# Patient Record
Sex: Female | Born: 1965 | Race: Black or African American | Hispanic: No | Marital: Single | Smoking: Never smoker
Health system: Southern US, Community
[De-identification: ages and names within clinical notes are randomized; demographics above are authoritative.]

## PROBLEM LIST (undated history)

## (undated) DIAGNOSIS — E079 Disorder of thyroid, unspecified: Secondary | ICD-10-CM

## (undated) DIAGNOSIS — E039 Hypothyroidism, unspecified: Secondary | ICD-10-CM

## (undated) DIAGNOSIS — M1990 Unspecified osteoarthritis, unspecified site: Secondary | ICD-10-CM

## (undated) DIAGNOSIS — I10 Essential (primary) hypertension: Secondary | ICD-10-CM

## (undated) DIAGNOSIS — Q969 Turner's syndrome, unspecified: Secondary | ICD-10-CM

## (undated) DIAGNOSIS — E7800 Pure hypercholesterolemia, unspecified: Secondary | ICD-10-CM

---

## 2004-08-29 ENCOUNTER — Ambulatory Visit (HOSPITAL_COMMUNITY): Admission: RE | Admit: 2004-08-29 | Discharge: 2004-08-29 | Payer: Self-pay | Admitting: Family Medicine

## 2004-08-29 ENCOUNTER — Inpatient Hospital Stay (HOSPITAL_COMMUNITY): Admission: AD | Admit: 2004-08-29 | Discharge: 2004-09-04 | Payer: Self-pay | Admitting: Family Medicine

## 2004-09-13 ENCOUNTER — Ambulatory Visit (HOSPITAL_COMMUNITY): Admission: RE | Admit: 2004-09-13 | Discharge: 2004-09-13 | Payer: Self-pay | Admitting: Family Medicine

## 2004-09-27 ENCOUNTER — Ambulatory Visit (HOSPITAL_COMMUNITY): Admission: RE | Admit: 2004-09-27 | Discharge: 2004-09-27 | Payer: Self-pay | Admitting: Family Medicine

## 2005-12-19 ENCOUNTER — Ambulatory Visit (HOSPITAL_COMMUNITY): Admission: RE | Admit: 2005-12-19 | Discharge: 2005-12-19 | Payer: Self-pay | Admitting: Family Medicine

## 2008-04-14 ENCOUNTER — Emergency Department (HOSPITAL_COMMUNITY): Admission: EM | Admit: 2008-04-14 | Discharge: 2008-04-14 | Payer: Self-pay | Admitting: Emergency Medicine

## 2008-09-25 ENCOUNTER — Ambulatory Visit: Payer: Self-pay | Admitting: Orthopedic Surgery

## 2008-09-25 DIAGNOSIS — M5137 Other intervertebral disc degeneration, lumbosacral region: Secondary | ICD-10-CM | POA: Insufficient documentation

## 2008-10-04 ENCOUNTER — Encounter: Payer: Self-pay | Admitting: Orthopedic Surgery

## 2008-10-04 ENCOUNTER — Ambulatory Visit: Admission: RE | Admit: 2008-10-04 | Discharge: 2008-10-04 | Payer: Self-pay | Admitting: Orthopedic Surgery

## 2009-12-15 ENCOUNTER — Emergency Department (HOSPITAL_COMMUNITY)
Admission: EM | Admit: 2009-12-15 | Discharge: 2009-12-15 | Payer: Self-pay | Source: Home / Self Care | Admitting: Emergency Medicine

## 2010-06-11 ENCOUNTER — Ambulatory Visit (HOSPITAL_COMMUNITY)
Admission: RE | Admit: 2010-06-11 | Discharge: 2010-06-11 | Payer: Self-pay | Source: Home / Self Care | Attending: Gastroenterology | Admitting: Gastroenterology

## 2010-07-23 NOTE — Miscellaneous (Signed)
Summary: Rehab PT order  Rehab PT order   Imported By: Cammie Sickle 09/28/2008 18:55:09  _____________________________________________________________________  External Attachment:    Type:   Image     Comment:   External Document

## 2010-07-23 NOTE — Assessment & Plan Note (Signed)
Summary: RT KNEE PAIN/XR AP IN FEB 45 2010/bcbs/mcinnis/bsf   Vital Signs:  Patient profile:   45 year old female Weight:      150 pounds Pulse rate:   88 / minute Resp:     18 per minute  Vitals Entered By: Fuller Canada MD (September 25, 2008 3:18 PM)  History of Present Illness: I saw Sandy Price in the office today for an initial visit.  She is a 45 years old woman with the complaint of:  right leg pain x 2-3 weeks     She has pain from her right hip down to her right ankle, has some back pain and numbness of her right leg and bilateral feet, mostly right.  Has right leg weakness.  No back dr, has scoliosis, no treatment.  Ibuprofen 200mg  as needed helps sometimes  Patient is diabetic. MEDS: Humalog, Lantus, crestor, aspirin, accupril, oscal, fosamax.         Preventive Screening-Counseling & Management     Alcohol drinks/day: 0     Smoking Status: never     Caffeine use/day: 2  Allergies (verified): No Known Drug Allergies  Past History:  Past Medical History:    diabetic    htn    cholesterol    osteoporosis  Past Surgical History:    NA  Family History:    FH of Cancer:     Family History of Diabetes    Family History Coronary Heart Disease female < 106    Hx, family, asthma  Social History:    Patient is single.     CNA    Alcohol drinks/day:  0    Caffeine use/day:  2    Smoking Status:  never  Review of Systems General:  Complains of weight gain and fatigue; denies weight loss, fever, and chills. Neuro:  Complains of weakness; denies headache, dizziness, migraines, numbness, tremor, and unsteady walking. MS:  Complains of joint pain and osteoporosis; denies rheumatoid arthritis, joint swelling, gout, bone cancer, and . Endo:  Complains of diabetes; denies thyroid disease and goiter. EENT:  Complains of bleeding gums; denies poor vision, cataracts, glaucoma, poor hearing, vertigo, ears ringing, sinusitis, hoarseness, and toothaches.  The  review of systems is negative for Cardiac , Resp, GI, GU, Psych, Derm, Immunology, and Lymphatic.  Physical Exam  Msk:   The general apperance was normal.  Pulses:  The pulses and perfusion were normal with normal color, temperature  and no swelling  Extremities:  lower extremities and upper extremities normal range of motion strength and stability alignment   Neurologic:  The coordination and sensation were normal  The reflexes were normal   Skin:  intact without lesions or rashes Inguinal Nodes:  no significant adenopathy Psych:  alert and cooperative; normal mood and affect; normal attention span and concentration   Detailed Back/Spine Exam  Lumbosacral Exam:  Inspection-deformity:    Abnormal Palpation-spinal tenderness:  Normal Lying Straight Leg Raise:    Right:  negative    Left:  negative   Impression & Recommendations:  Problem # 1:  DEGENERATIVE DISC DISEASE, LUMBAR SPINE (ICD-722.52)  Assessment:  previous lumbar spine.  Findings significant degenerative changes in the lumbar spine with curvature.   ImpressionCohen Price scoliosis scoliosis mild degenerative disc disease moderate  As this is mild and the patient has improved without any major treatment other than over-the-counter ibuprofen a home exercise program should suffice.  Orders: New Patient Level III (72536) Lumbosacral Spine ,2/3 views (64403)  Patient  Instructions: 1)  Most patients (90%) of patients with low back pain will improve with time (2-6 weeks). Limit activity to comfort and avoid activities that increase discomfort.  Apply moist heat and/or ice to lower back and take medication as instructed for pain relief. Please read the Back Pain Handout and start Physical Therapy as directed.  2)  visit PT 1 time for HEP  3)  Please schedule a follow-up appointment as needed.

## 2010-07-23 NOTE — Letter (Signed)
Summary: History form  History form   Imported By: Jacklynn Ganong 10/04/2008 12:17:53  _____________________________________________________________________  External Attachment:    Type:   Image     Comment:   External Document

## 2010-07-23 NOTE — Miscellaneous (Signed)
Summary: PT Clinical evaluation  PT Clinical evaluation   Imported By: Jacklynn Ganong 10/18/2008 09:29:19  _____________________________________________________________________  External Attachment:    Type:   Image     Comment:   External Document

## 2010-11-08 NOTE — Group Therapy Note (Signed)
NAME:  Price, Sandy                  ACCOUNT NO.:  [id number]   MEDICAL RECORD NO.:  [id number]          PATIENT TYPE:  INP   LOCATION:  A339                          FACILITY:  [facility]   PHYSICIAN:  Angus G. Renard Matter, MD   DATE OF BIRTH:  [birth year]   DATE OF PROCEDURE:  DATE OF DISCHARGE:                                   PROGRESS NOTE   SUBJECTIVE:  This patient still is running a low-grade fever.  She was  admitted with a right middle lobe pneumonia.  She does have a history of  noninsulin dependent diabetes.   OBJECTIVE:  Vital signs:  Blood pressure 149/94, respirations 20, pulse 120.  Temperature 101.2.  Lungs:  Diminished breath sounds.  Heart:  Regular  rhythm.  Abdomen:  No palpable organs or masses.   ASSESSMENT:  The patient was admitted with right middle and lower-lobe  pneumonia, and history of diabetes mellitus.   PLAN:  Continue IV Levaquin.  Repeat CBC.  Adjust insulin dosage.      AGM/MEDQ  D:  09/01/2004  T:  09/01/2004  Job:  [id number]

## 2010-11-08 NOTE — H&P (Signed)
NAME:  Sandy Price, Sandy Price                  ACCOUNT NO.:  [id number]   MEDICAL RECORD NO.:  [id number]          PATIENT TYPE:  INP   LOCATION:  A339                          FACILITY:  [facility]   PHYSICIAN:  Angus G. McInnis, MD   DATE OF BIRTH:  October 01, 1965   DATE OF ADMISSION:  08/29/2004  DATE OF DISCHARGE:  LH                                HISTORY & PHYSICAL   HISTORY OF PRESENT ILLNESS:  The patient is a 45 year old African-American  female who was seen in the office on August 27, 2004 with a complaint of pain  under her right breast.  She did have a cough, but was stating that she had  not run any fever.  She was examined, and noted to have diminished breath  sounds over the right lower lobe.  A subsequently chest x-ray showed right  middle lobe pneumonia.  She was seen again in the office on the day of  admission with continued cough and fever.  It was felt that the patient  should be admitted to the hospital for more intense treatment of right  middle lobe pneumonia.   SOCIAL HISTORY:  The patient is single.  Does not smoke or drink alcohol.   FAMILY HISTORY:  Noncontributory.   PAST MEDICAL AND SURGICAL HISTORY:  1.  The patient has had no previous surgery.  2.  She is a type 2 diabetic.  3.  She has no other medical illnesses.   ALLERGIES:  The patient has no allergies.   MEDICATIONS:  1.  Metaglip 2 tablets b.i.d.  2.  Altace 10 mg.  3.  Starlix 120 mg t.i.d.  4.  Fosamax 70 mg daily.  5.  Os-Cal D.  6.  Mobic 15 mg daily.  7.  Lipitor 20 mg daily.   PHYSICAL EXAMINATION:  GENERAL:  Alert female with slight shortness of  breath.  VITAL SIGNS:  Blood pressure 110/60, pulse 60, respirations 18, temperature  103.  HEENT:  Eyes - pupils equal, round and reactive to light and accommodation.  TMs negative.  Oropharynx benign.  NECK:  Supple.  No JVD or thyroid abnormalities.  HEART:  Regular rhythm.  No murmurs, no cardiomegaly.  LUNGS:  Diminished breath sounds over the right  lower chest.  ABDOMEN:  No palpable organs or masses.  EXTREMITIES:  Free of edema.  NEUROLOGIC:  No focal deficit.   DIAGNOSES:  1.  Bronchopneumonia, right middle lobe.  2.  History of noninsulin-dependent diabetes.  3.  Dyslipidemia.      AGM/MEDQ  D:  08/29/2004  T:  08/29/2004  Job:  [id number]

## 2010-11-08 NOTE — Group Therapy Note (Signed)
NAME:  Sandy Price, Sandy Price                  ACCOUNT NO.:  [id number]   MEDICAL RECORD NO.:  [id number]          PATIENT TYPE:  INP   LOCATION:  A339                          FACILITY:  [facility]   PHYSICIAN:  Angus G. Renard Matter, MD   DATE OF BIRTH:  09/29/1965   DATE OF PROCEDURE:  DATE OF DISCHARGE:                                   PROGRESS NOTE   SUBJECTIVE:  This patient remains afebrile.  She was admitted with right  middle lobe pneumonia.  She does have a history of non-insulin dependent  diabetes.  Her sugars have been 400, 446, and 449.  She is being covered  with sliding scale Humalog insulin.   OBJECTIVE:  VITAL SIGNS:  Blood pressure 119/89, respirations 20, pulse 105,  temperature 98.2.  LUNGS:  Diminished breath sounds on the right.  HEART:  Regular rhythm.  ABDOMEN:  No palpable organs or masses.   ASSESSMENT:  The patient was admitted with right middle and lower pneumonia.   PLAN:  Continue current IV antibiotics, nebulizer treatments, etc.      AGM/MEDQ  D:  08/30/2004  T:  08/30/2004  Job:  [id number]

## 2010-11-08 NOTE — Discharge Summary (Signed)
NAME:  Sandy Price, Sandy Price                  ACCOUNT NO.:  [id number]   MEDICAL RECORD NO.:  [id number]          PATIENT TYPE:  INP   LOCATION:  A339                          FACILITY:  [facility]   PHYSICIAN:  Angus G. Renard Matter, MD   DATE OF BIRTH:  [birth year]   DATE OF ADMISSION:  08/29/2004  DATE OF DISCHARGE:  03/15/2006LH                                 DISCHARGE SUMMARY   DISCHARGE DIAGNOSES:  1.  Bronchopneumonia.  2.  Diabetes mellitus type 2.  3.  Hyperlipidemia.   CONDITION ON DISCHARGE:  Stable and improved.   HISTORY OF PRESENT ILLNESS:  This 45 year old African-American female was  seen in the office on August 27, 2004, with a complaint of pani under her  right breast.  She did have a cough but was stating that she had not run any  fever.  She was examined and noted to have diminished breath sounds over the  right lower lobe.  A subsequent x-ray showed right middle lobe pneumonia.  She was again seen in the office on the day of admission with continued  cough and fever.  It was felt she should be admitted to the hospital for  more intense treatment of right middle lobe pneumonia.   PHYSICAL EXAMINATION:  GENERAL:  An alert female with slight shortness of  breath.  VITAL SIGNS:  Blood pressure 110/60, respirations 18, pulse 60, temperature  103.  HEENT:  Eyes:  PERRLA.  TM negative.  Oropharynx benign.  NECK:  Supple, no JVD or thyroid abnormalities.  HEART:  Regular rhythm, no murmurs, no cardiomegaly.  LUNGS:  Diminished breath sounds over the right lower chest and right lower  lung.  ABDOMEN:  No palpable organs or masses.  EXTREMITIES:  Free of edema.  NEUROLOGIC:  No focal deficit.   LABORATORY DATA:  Admission CBC, WBC 12,500 with a hemoglobin of 11.1,  hematocrit 32.3.  Subsequent CBC on September 03, 2004, WBC 8200 with a  hemoglobin of 10.2 and hematocrit 28.2.  Chemistries on admission sodium  128, potassium 4, chloride 94, CO2 23, glucose 470, BUN 10, creatinine 1.2,  calcium 8.2, total protein 6.6.  Subsequent chemistries on September 01, 2004,  sodium 134, potassium 3.8, chloride 102, CO2 26, glucose 176, BUN 9,  creatinine 0.8, calcium 8.2.  Urinalysis negative.  Blood culture no growth  after five days.  Chest x-ray showed right middle lobe pneumonia.  Subsequent x-rays showed persistent right middle lobe pneumonia with no  change.   HOSPITAL COURSE:  The patient at the time of her admission was placed on IV  fluids half normal saline KVO rate.  IV Levaquin 750 mg daily.  Neb  treatments with albuterol and Atrovent every 4 hours while awake.  Nasal O2  at two liters per minute.  Codiclear liquid one teaspoon q.4h. p.r.n. for  cough.  Humibid L.A. one b.i.d.  Tylenol 650 mg every 4 hours p.r.n. for  temperature over 101.  Accu-Chek a.c. and h.s.  She was continued on home  medications which included Lipitor 10 mg daily, Altace 10 mg daily.  She was  placed on Humalog insulin according to sliding scale and subcutaneous  Lovenox 20 mg daily.  The patient showed progressive improvement and her  blood cultures were negative.  Chest x-ray showed persistent right middle  lobe consolidation with no change.  The patient was started on Lantus  insulin toward the latter part of her hospital stay 25 units daily and  sliding scale Humalog insulin.  She felt much better at the time of her  discharge.  She was discharged after six days of hospitalization.   DISCHARGE MEDICATIONS:  1.  Lantus insulin 25 units daily.  2.  Levaquin 750 mg daily.  3.  Altace 10 mg daily.  4.  Zocor 20 mg daily.  5.  Albuterol inhaler.  6.  Humibid L.A. one b.i.d.  7.  Humalog insulin sliding scale.  8.  Robitussin one teaspoon q.i.d.      AGM/MEDQ  D:  09/23/2004  T:  09/23/2004  Job:  [id number]

## 2010-11-08 NOTE — Group Therapy Note (Signed)
NAME:  Sandy Price, Sandy Price                  ACCOUNT NO.:  [id number]   MEDICAL RECORD NO.:  [id number]          PATIENT TYPE:  INP   LOCATION:  A339                          FACILITY:  [facility]   PHYSICIAN:  Angus G. Renard Matter, MD   DATE OF BIRTH:  1965-08-16   DATE OF PROCEDURE:  09/02/2004  DATE OF DISCHARGE:                                   PROGRESS NOTE   This patient had a low grade fever yesterday of 101.5. Her sugars have  ranged from 139 to 346. She has been covered with Humalog insulin. This  patient is being treated for bronchopneumonia in the right middle and lower  lobe.   OBJECTIVE:  VITAL SIGNS:  Blood pressure 118/70, respirations 20, pulse 99,  temperature 99.1.  LUNGS:  Diminished breath sounds.  HEART:  Regular rhythm.  ABDOMEN:  No palpable organs or masses.   ASSESSMENT:  The patient was admitted with bronchopneumonia involving the  right middle and lower lobe. She does have diabetes, which is requiring  multiple doses of Humalog insulin for control.   PLAN:  Continue current regimen. Will start Lantus insulin today.      AGM/MEDQ  D:  09/02/2004  T:  09/02/2004  Job:  [id number]

## 2010-11-08 NOTE — Group Therapy Note (Signed)
NAME:  Sandy Price, Sandy Price                  ACCOUNT NO.:  [id number]   MEDICAL RECORD NO.:  [id number]          PATIENT TYPE:  INP   LOCATION:  A339                          FACILITY:  [facility]   PHYSICIAN:  Angus G. Renard Matter, MD   DATE OF BIRTH:  1966-01-13   DATE OF PROCEDURE:  DATE OF DISCHARGE:                                   PROGRESS NOTE   SUBJECTIVE:  This patient was admitted with bronchopneumonia in right middle  and lower lobe.  She is still running some fever.   OBJECTIVE:  Vital signs:  Blood pressure 139/85, respirations 18, pulse 119,  temperature 100.1.  The patient's blood gasses showed a pH of 7.458, with a  pCO2 of 31.6, pO2 of 92.6.  Lungs:  Diminished breath sounds over the right  lower lobe.  Heart:  Sinus tachycardia.  Abdomen:  No palpable organs or  masses.   ASSESSMENT:  The patient was admitted with bronchopneumonia, right middle  and lower lobe.  Blood cultures showed no growth.   PLAN:  Repeat chest x-ray today, continue current regimen.      AGM/MEDQ  D:  08/31/2004  T:  08/31/2004  Job:  [id number]

## 2010-11-08 NOTE — Group Therapy Note (Signed)
NAME:  Sandy Price, Sandy Price                  ACCOUNT NO.:  [id number]   MEDICAL RECORD NO.:  [id number]          PATIENT TYPE:  INP   LOCATION:  A339                          FACILITY:  [facility]   PHYSICIAN:  Angus G. Renard Matter, MD   DATE OF BIRTH:  Feb 16, 1966   DATE OF PROCEDURE:  DATE OF DISCHARGE:                                   PROGRESS NOTE   This patient has remained afebrile since yesterday.  Her sugars have ranged  from 144-312.  She currently is on Lantus insulin with Humalog sliding  scale.  Is feeling some better with less cough.   OBJECTIVE:  VITAL SIGNS:  Blood pressure 117/73, respirations 18, pulse 82,  temperature 98.1.  LUNGS:  Clear to P&A.  HEART:  Regular rhythm.  ABDOMEN:  No palpable organs or masses.   ASSESSMENT:  Patient was admitted with bronchopneumonia involving the right  middle and lower lobe.  She also has diabetes which has required multiple  doses of Humalog insulin plus Lantus insulin for control.   PLAN:  Continue current regimen.  Will increase dose of Lantus insulin and  repeat chest x-ray today.      AGM/MEDQ  D:  09/03/2004  T:  09/03/2004  Job:  [id number]

## 2011-01-28 ENCOUNTER — Ambulatory Visit: Payer: Self-pay | Admitting: Orthopedic Surgery

## 2011-03-24 LAB — DIFFERENTIAL
Basophils Absolute: 0
Basophils Relative: 1
Eosinophils Absolute: 0
Eosinophils Relative: 1
Lymphocytes Relative: 18
Lymphs Abs: 1.1
Monocytes Absolute: 0.4
Monocytes Relative: 6
Neutro Abs: 4.6
Neutrophils Relative %: 75

## 2011-03-24 LAB — CBC
HCT: 33.2 — ABNORMAL LOW
Hemoglobin: 11.5 — ABNORMAL LOW
MCHC: 34.7
MCV: 93.4
Platelets: 308
RBC: 3.55 — ABNORMAL LOW
RDW: 13.4
WBC: 6.2

## 2011-03-24 LAB — D-DIMER, QUANTITATIVE: D-Dimer, Quant: 0.33

## 2011-07-29 ENCOUNTER — Other Ambulatory Visit (HOSPITAL_COMMUNITY): Payer: Self-pay | Admitting: Physician Assistant

## 2011-07-29 DIAGNOSIS — Z1231 Encounter for screening mammogram for malignant neoplasm of breast: Secondary | ICD-10-CM

## 2011-07-30 ENCOUNTER — Ambulatory Visit (HOSPITAL_COMMUNITY): Admission: RE | Admit: 2011-07-30 | Payer: Self-pay | Source: Ambulatory Visit

## 2011-07-31 ENCOUNTER — Ambulatory Visit (HOSPITAL_COMMUNITY)
Admission: RE | Admit: 2011-07-31 | Discharge: 2011-07-31 | Disposition: A | Discharge status: Home / Self Care | Payer: Self-pay | Source: Ambulatory Visit | Attending: Physician Assistant | Admitting: Physician Assistant

## 2011-07-31 DIAGNOSIS — Z1231 Encounter for screening mammogram for malignant neoplasm of breast: Secondary | ICD-10-CM

## 2011-12-24 ENCOUNTER — Encounter (HOSPITAL_COMMUNITY): Payer: Self-pay | Admitting: *Deleted

## 2011-12-24 ENCOUNTER — Emergency Department (HOSPITAL_COMMUNITY)
Admission: EM | Admit: 2011-12-24 | Discharge: 2011-12-24 | Disposition: A | Discharge status: Home / Self Care | Payer: Self-pay | Attending: Emergency Medicine | Admitting: Emergency Medicine

## 2011-12-24 DIAGNOSIS — Q969 Turner's syndrome, unspecified: Secondary | ICD-10-CM | POA: Insufficient documentation

## 2011-12-24 DIAGNOSIS — H5789 Other specified disorders of eye and adnexa: Secondary | ICD-10-CM | POA: Insufficient documentation

## 2011-12-24 DIAGNOSIS — E7800 Pure hypercholesterolemia, unspecified: Secondary | ICD-10-CM | POA: Insufficient documentation

## 2011-12-24 DIAGNOSIS — H109 Unspecified conjunctivitis: Secondary | ICD-10-CM

## 2011-12-24 DIAGNOSIS — E119 Type 2 diabetes mellitus without complications: Secondary | ICD-10-CM | POA: Insufficient documentation

## 2011-12-24 DIAGNOSIS — I10 Essential (primary) hypertension: Secondary | ICD-10-CM | POA: Insufficient documentation

## 2011-12-24 DIAGNOSIS — M1990 Unspecified osteoarthritis, unspecified site: Secondary | ICD-10-CM | POA: Insufficient documentation

## 2011-12-24 HISTORY — DX: Pure hypercholesterolemia, unspecified: E78.00

## 2011-12-24 HISTORY — DX: Disorder of thyroid, unspecified: E07.9

## 2011-12-24 HISTORY — DX: Essential (primary) hypertension: I10

## 2011-12-24 HISTORY — DX: Unspecified osteoarthritis, unspecified site: M19.90

## 2011-12-24 HISTORY — DX: Turner's syndrome, unspecified: Q96.9

## 2011-12-24 MED ORDER — TOBRAMYCIN 0.3 % OP OINT
TOPICAL_OINTMENT | Freq: Three times a day (TID) | OPHTHALMIC | Status: DC
  Administered 2011-12-24: 1 via OPHTHALMIC
  Filled 2011-12-24: qty 3.5

## 2011-12-24 MED ORDER — TOBRAMYCIN 0.3 % OP SOLN
OPHTHALMIC | Status: AC
  Filled 2011-12-24: qty 5

## 2011-12-24 NOTE — ED Notes (Signed)
Patient with no complaints at this time. Respirations even and unlabored. Skin warm/dry. Discharge instructions reviewed with patient at this time. Patient given opportunity to voice concerns/ask questions. Patient discharged at this time and left Emergency Department with steady gait.   

## 2011-12-24 NOTE — ED Provider Notes (Signed)
History     CSN: [id number]  Arrival date & time 12/24/11  3875   First MD Initiated Contact with Patient 12/24/11 [phone]      Chief Complaint  Patient presents with  . Eye Problem    (Consider location/radiation/quality/duration/timing/severity/associated sxs/prior treatment) HPI This is a 46 year old black female with a two-day history of edema of the eyelids particularly on the right thigh. This is been a company by a brief feeling eyes. She denies blurred vision. She has been using Benadryl without relief. She presents here suspecting she may have an infection. The symptoms are mild to moderate.  Past Medical History  Diagnosis Date  . Diabetes mellitus   . Hypertension   . Turner syndrome   . DJD (degenerative joint disease)   . Hypercholesteremia     History reviewed. No pertinent past surgical history.  No family history on file.  History  Substance Use Topics  . Smoking status: Never Smoker   . Smokeless tobacco: Not on file  . Alcohol Use: No    OB History    Grav Para Term Preterm Abortions TAB SAB Ect Mult Living                  Review of Systems  All other systems reviewed and are negative.    Allergies  Review of patient's allergies indicates no known allergies.  Home Medications  No current outpatient prescriptions on file.  BP 171/86  Pulse 60  Temp 97.8 F (36.6 C) (Oral)  Resp 18  Ht 4\' 11"  (1.499 m)  Wt 132 lb (59.875 kg)  BMI 26.66 kg/m2  SpO2 100%  Physical Exam General: Well-developed, well-nourished female in no acute distress; appearance consistent with age of record HENT: normocephalic, atraumatic Eyes: pupils equal round and reactive to light; extraocular muscles intact; edema of eyelids more prominent on the right; no conjunctival injection; no erythema of eyelids; no exudate Neck: supple Heart: regular rate and rhythm Lungs: clear to auscultation bilaterally Abdomen: soft; nondistended; nontender Extremities: No  deformity; full range of motion Neurologic: Awake, alert; motor function intact in all extremities and symmetric; no facial droop Skin: Warm and dry Psychiatric: Normal mood and affect      ED Course  Procedures (including critical care time)     MDM  Symptoms may be allergic in nature but we will add a topical antibiotic ointment to treating he infection. This should also help relieve the patient's foreign body sensation. Patient was advised that Benadryl may cause dryness.        Hanley Seamen, MD 12/24/11 [phone]

## 2011-12-24 NOTE — ED Notes (Signed)
Pt reports right eye being puffy for about 1 week, has taken benadryl w/ no positive results.

## 2012-07-13 ENCOUNTER — Other Ambulatory Visit (HOSPITAL_COMMUNITY): Payer: Self-pay | Admitting: Physician Assistant

## 2012-07-13 DIAGNOSIS — IMO0001 Reserved for inherently not codable concepts without codable children: Secondary | ICD-10-CM

## 2012-08-03 ENCOUNTER — Ambulatory Visit (HOSPITAL_COMMUNITY)
Admission: RE | Admit: 2012-08-03 | Discharge: 2012-08-03 | Disposition: A | Discharge status: Home / Self Care | Payer: Self-pay | Source: Ambulatory Visit | Attending: Physician Assistant | Admitting: Physician Assistant

## 2012-08-03 DIAGNOSIS — IMO0001 Reserved for inherently not codable concepts without codable children: Secondary | ICD-10-CM

## 2012-11-11 ENCOUNTER — Telehealth (HOSPITAL_COMMUNITY): Payer: Self-pay | Admitting: Dietician

## 2012-11-11 NOTE — Telephone Encounter (Signed)
[facility] Diabetes Class Completion  Date:Nov 11, 2012  Time: 1730  Pt attended [facility]'s Diabetes Group Education Class on Nov 11, 2012.   Patient was educated on the following topics: survival skills (signs and symptoms of hyperglycemia and hypoglycemia, treatment for hypoglycemia, ideal levels for fasting and postprandial blood sugars, goal Hgb A1c level, foot care basics), recommendations for physical activity, carbohydrate metabolism in relation to diabetes, and meal planning (sources of carbohydrate, carbohydrate counting, meal planning strategies, food label reading, and portion control).   Melody Haver, RD, LDN

## 2013-12-15 ENCOUNTER — Other Ambulatory Visit (HOSPITAL_COMMUNITY): Payer: Self-pay | Admitting: Physician Assistant

## 2013-12-15 DIAGNOSIS — Z1231 Encounter for screening mammogram for malignant neoplasm of breast: Secondary | ICD-10-CM

## 2014-01-02 ENCOUNTER — Ambulatory Visit (HOSPITAL_COMMUNITY)
Admission: RE | Admit: 2014-01-02 | Discharge: 2014-01-02 | Disposition: A | Discharge status: Home / Self Care | Payer: Self-pay | Source: Ambulatory Visit | Attending: Physician Assistant | Admitting: Physician Assistant

## 2014-01-02 DIAGNOSIS — Z1231 Encounter for screening mammogram for malignant neoplasm of breast: Secondary | ICD-10-CM

## 2014-04-26 ENCOUNTER — Encounter: Payer: Self-pay | Attending: Physician Assistant | Admitting: Nutrition

## 2014-04-26 ENCOUNTER — Encounter: Payer: Self-pay | Admitting: Nutrition

## 2014-04-26 VITALS — Ht 59.0 in | Wt 137.6 lb

## 2014-04-26 DIAGNOSIS — Z794 Long term (current) use of insulin: Secondary | ICD-10-CM | POA: Insufficient documentation

## 2014-04-26 DIAGNOSIS — Z713 Dietary counseling and surveillance: Secondary | ICD-10-CM | POA: Insufficient documentation

## 2014-04-26 DIAGNOSIS — E1165 Type 2 diabetes mellitus with hyperglycemia: Secondary | ICD-10-CM

## 2014-04-26 DIAGNOSIS — IMO0002 Reserved for concepts with insufficient information to code with codable children: Secondary | ICD-10-CM

## 2014-04-26 DIAGNOSIS — E118 Type 2 diabetes mellitus with unspecified complications: Secondary | ICD-10-CM | POA: Insufficient documentation

## 2014-04-26 NOTE — Progress Notes (Signed)
  Medical Nutrition Therapy:  Appt start time: 1100 end time:  1200.   Assessment:  Primary concerns today: Diabetes. Tests her blood sugars every morning. FBS 275 mg/dl. Gives herself her own insulin 40 units of Lantus daily. Lives with her Aunt. Her aunt does the shopping and cooking. Walks in the morning and some sit ups. Has arthritis. Goes to the [facility] a few times during the week. Most recent A1C was 8.8% per the [facility] [place]. She notes she has been eating sweets at times.  Preferred Learning Style:     No preference indicated   Learning Readiness:     Ready  Change in progress  MEDICATIONS:    DIETARY INTAKE:   24-hr recall:  B ( AM):  Skip breakfast.  Snk ( AM):  LD ( PM): [employer] tunafish 12 inch, Gatorade 16 oz  Snk ( PM): payday, milkyway and licorice spaced between. Dinner: sandwich, or soup and maybe some vegetables. water Beverages: water, diet sodas  Usual physical activity: walks some  Estimated energy needs: 1500 calories 170 g carbohydrates 112 g protein 42 g fat  Progress Towards Goal(s):  In progress.   Nutritional Diagnosis:  Nutrition and diabetes knowledge deficit  As related to  diabetes As evidenced by A1C of 8.8%.    Intervention:  Nutrition Counseling and diabetes education on diet, portion control and targets for BS ranges.  Plan:  Aim for 2-3 Carb Choices per meal (30-45 grams) +/- 1 either way  No snacks between meals. Avoid candy and sweets. Include protein in moderation with your meals  Consider  increasing your activity level by 30 minutes daily as tolerated Check BS before meals and has bedtime for 1 week and record on BS log til next visit. Be sure to take Lantus daily at the same time-40 units at night. Eat Breakfast at 8 am, Lunch 12-1 pm and dinner 5-6 pm Do not skip meals. Eat Breakfast daily. Drink water with meals and snacks ,cut out sodas, tea and juice and gatorade.  Goal: Get A1C down to 7% in  three months.   Teaching Method Utilized:  Visual Auditory Hands on  Handouts given during visit include: Living Well with Diabetes Carb Counting and Food Label handouts Meal Plan Card  Barriers to learning/adherence to lifestyle change: none  Demonstrated degree of understanding via:  Teach Back   Monitoring/Evaluation:  Dietary intake, exercise, meal planning, and body weight 1 week.

## 2014-05-03 NOTE — Patient Instructions (Signed)
Plan:  Aim for 2-3 Carb Choices per meal (30-45 grams) +/- 1 either way  No snacks between meals. Avoid candy and sweets. Include protein in moderation with your meals  Consider  increasing your activity level by 30 minutes daily as tolerated Check BS before meals and has bedtime for 1 week and record on BS log til next visit. Be sure to take Lantus daily at the same time-40 units at night. Eat Breakfast at 8 am, Lunch 12-1 pm and dinner 5-6 pm Do not skip meals. Eat Breakfast daily. Drink water with meals and snacks ,cut out sodas, tea and juice and gatorade.

## 2014-05-04 ENCOUNTER — Ambulatory Visit: Payer: Self-pay | Admitting: Nutrition

## 2014-05-04 ENCOUNTER — Encounter: Payer: Self-pay | Admitting: Nutrition

## 2014-05-04 NOTE — Progress Notes (Signed)
  Medical Nutrition Therapy:  Appt start time: 1100 end time:  1130.   Assessment:  Primary concerns today: Diabetes. Follow up appointment. Eating more vegetables and eating protein with meals. She has avoided hot dogs and sweets. Walks and goes to [employer]. Has needles and syringes instead of a pen for her insulin since she doesn't have any insurance. Gets her meds from the [facility] in [place].Marland Kitchen Has not been injecting air in the vial before withdrawing the insulin and therefore has varying blood sugar readings. She notes she has been feeling better since she is eating better overall. Does feel tired and sluggish when her blood sugars have been in the 200's.  Preferred Learning Style:     No preference indicated   Learning Readiness:     Ready  Change in progress  MEDICATIONS:    DIETARY INTAKE:   24-hr recall:  B ( AM):  Bran flakes, milk, fruit Snk ( AM):  LD ( PM): [employer] tunafish 12 inch, Gatorade 16 oz  Snk ( PM):no snacks. Dinner: sandwich, or soup and maybe some vegetables. water Beverages: water, diet sodas  Usual physical activity: walks some  Estimated energy needs: 1500 calories 170 g carbohydrates 112 g protein 42 g fat  Progress Towards Goal(s):  In progress.   Nutritional Diagnosis:  Nutrition and diabetes knowledge deficit  As related to  diabetes As evidenced by A1C of 8.8%.    Intervention:  Nutrition Counseling and diabetes education on diet, portion control and targets for BS ranges. Reviewed proper insulin administration and importance of injecting the units amount of air into syringe before withdrawing the amounts of insulin needed.  Plan:  Aim for 2-3 Carb Choices per meal (30-45 grams) +/- 1 either way  No snacks between meals. Avoid candy and sweets. Include protein in moderation with your meals  Consider  increasing your activity level by 30 minutes daily as tolerated Check BS before meals and has bedtime for 1 week and record on BS log  til next visit. Be sure to take Lantus daily at the same time-40 units at night. Eat Breakfast at 8 am, Lunch 12-1 pm and dinner 5-6 pm Do not skip meals. Eat Breakfast daily. Drink water with meals and snacks ,cut out sodas, tea and juice and gatorade.  Goal: Get A1C down to 7% in three months.   Teaching Method Utilized:  Visual Auditory Hands on  Handouts given during visit include: Living Well with Diabetes Carb Counting and Food Label handouts Meal Plan Card  Barriers to learning/adherence to lifestyle change: none  Demonstrated degree of understanding via:  Teach Back   Monitoring/Evaluation:  Dietary intake, exercise, meal planning, and body weight 1 month.

## 2014-05-22 ENCOUNTER — Encounter: Payer: Self-pay | Admitting: Physician Assistant

## 2014-06-07 ENCOUNTER — Ambulatory Visit: Payer: Self-pay | Admitting: Nutrition

## 2014-06-12 ENCOUNTER — Encounter: Payer: Self-pay | Attending: Physician Assistant | Admitting: Nutrition

## 2014-06-12 ENCOUNTER — Encounter: Payer: Self-pay | Admitting: Nutrition

## 2014-06-12 VITALS — Ht 59.0 in | Wt 142.8 lb

## 2014-06-12 DIAGNOSIS — E1165 Type 2 diabetes mellitus with hyperglycemia: Secondary | ICD-10-CM

## 2014-06-12 DIAGNOSIS — Z713 Dietary counseling and surveillance: Secondary | ICD-10-CM | POA: Insufficient documentation

## 2014-06-12 DIAGNOSIS — IMO0002 Reserved for concepts with insufficient information to code with codable children: Secondary | ICD-10-CM

## 2014-06-12 DIAGNOSIS — Z794 Long term (current) use of insulin: Secondary | ICD-10-CM | POA: Insufficient documentation

## 2014-06-12 DIAGNOSIS — E118 Type 2 diabetes mellitus with unspecified complications: Secondary | ICD-10-CM | POA: Insufficient documentation

## 2014-06-12 NOTE — Progress Notes (Signed)
  Medical Nutrition Therapy:  Appt start time: 1100 end time:  1130.   Assessment:  Primary concerns today: Diabetes. Follow up Has been skipping breakfast a few times. Taking 40 units of Lantus. Has been working on drinking more water. Is now injecting air into the vial. Just recently got a job. Has been going to the [facility] for exercise. She has been going twice a week. Had 8 lbs weight gain since last visit. Admits to snacking more and not eating as well as she should be eating. Has been drinking Gatorade and eating nuts and other snacks between meals. Brought in her BS log and FBS 105-241 and HS BS were 86-252 mg/dl. She thinks her BS reflect her food choices. Overall, she blood sugars look much better.  Preferred Learning Style:     No preference indicated   Learning Readiness:     Ready  Change in progress  MEDICATIONS:    DIETARY INTAKE:   24-hr recall:  B ( AM):  Coffee, or banana, Snk ( AM):  LD ( PM): Sandwich-  Snk ( PM):no snacks. Dinner: Steak, turnip greens, 1 roll, apple water Beverages: water, diet sodas  Usual physical activity: walks some  Estimated energy needs: 1500 calories 170 g carbohydrates 112 g protein 42 g fat  Progress Towards Goal(s):  In progress.   Nutritional Diagnosis:  Nutrition and diabetes knowledge deficit  As related to  diabetes As evidenced by A1C of 8.8%.    Intervention:  Nutrition Counseling and diabetes education on diet, portion control and targets for BS ranges. Reviewed proper insulin administration and importance of injecting the units amount of air into syringe before withdrawing the amounts of insulin needed.  Plan:  1. Increase exercise to 60 minutes three times per week for needed weight loss. 2. Cut out snacks between meals. 3. Lose 1 lb per week til next viist. 4. Increase  Low carb vegetables to 2 servings for lunch and dinner. 5. Avoid salty foods. 6. Cut out gatorade and drink only water with meals. 7. Choose  1% milk instead of whole milk.  Goal: Get A1C down to 7% in three months.   Teaching Method Utilized:  Visual Auditory Hands on  Handouts given during visit include: The Plate Method Carb Counting and Food Label handouts Meal Plan Card  Barriers to learning/adherence to lifestyle change: none  Demonstrated degree of understanding via:  Teach Back   Monitoring/Evaluation:  Dietary intake, exercise, meal planning, and body weight 1 month.

## 2014-06-12 NOTE — Patient Instructions (Signed)
Plan:  1. Increase exercise to 60 minutes three times per week for needed weight loss. 2. Cut out snacks between meals. 3. Lose 1 lb per week til next viist. 4. Increase  Low carb vegetables to 2 servings for lunch and dinner. 5. Avoid salty foods. 6. Cut out gatorade and drink only water with meals. 7. Choose 1% milk instead of whole milk.  Goal: Get A1C down to 7% in three month s.

## 2014-08-21 ENCOUNTER — Ambulatory Visit: Payer: Self-pay | Admitting: Nutrition

## 2014-08-28 ENCOUNTER — Encounter: Payer: 59 | Attending: "Endocrinology | Admitting: Nutrition

## 2014-08-28 VITALS — Ht 59.0 in | Wt 154.2 lb

## 2014-08-28 DIAGNOSIS — Z794 Long term (current) use of insulin: Secondary | ICD-10-CM | POA: Diagnosis not present

## 2014-08-28 DIAGNOSIS — E1165 Type 2 diabetes mellitus with hyperglycemia: Secondary | ICD-10-CM

## 2014-08-28 DIAGNOSIS — IMO0002 Reserved for concepts with insufficient information to code with codable children: Secondary | ICD-10-CM

## 2014-08-28 DIAGNOSIS — Z713 Dietary counseling and surveillance: Secondary | ICD-10-CM | POA: Diagnosis not present

## 2014-08-28 DIAGNOSIS — E118 Type 2 diabetes mellitus with unspecified complications: Secondary | ICD-10-CM | POA: Insufficient documentation

## 2014-08-28 NOTE — Progress Notes (Signed)
  Medical Nutrition Therapy:  Appt start time: 1100 end time:  1115.  Assessment:  Primary concerns today: Diabetes. Now eating breakfast. 40 Units of Lantus daily in evening. Currently on syringes and vial but now has insurance and would like to have the insulin pens for greater accuracy. FBS:161 mg/dl  Cut out the gatorade but still drinking some soda as times and eating sweets, which explains why her A1C is 8.1% . Will be seing Dr. Delphina Cahill in May 2016 as her new PCP. Most recent A1C was 8.1%. Has been walking some for exercise. Weight is up a few pounds from last visit. She notes she has been eating more and not exercising since she has been in school.  Preferred Learning Style:     No preference indicated   Learning Readiness:     Ready  Change in progress  MEDICATIONS:    DIETARY INTAKE:   24-hr recall:  Food journal brought in. B)Grits, 2 slices toast and egg, L) big mac, ff, Sprite and water DPopcorn chicken, colalrds, potato salad, squash, peach soda Snack: nuts and sometimes desserts like cake and ice cream  Usual physical activity: walking  Estimated energy needs: 1500 calories 170 g carbohydrates 112 g protein 42 g fat  Progress Towards Goal(s):  In progress.   Nutritional Diagnosis:  Nutrition and diabetes knowledge deficit  As related to  diabetes As evidenced by A1C of 8.1%.    Intervention:  Nutrition Counseling and diabetes education on meal planning and target ranges for blood sugars.  Plan:  1. Increase exercise to 60 minutes three times per week for needed weight loss. 2. Ask Dr. Nevada Crane to change your lantus to Lantus Pen. 3. Cut out soda--regular and diet sodas Cut out sweets, cakes, coookies and junk food 4. Increase  Low carb vegetables  Add fruit and vegetables to meals. 5. Avoid salty foods. 6. Only Drink water Lose 1 lb per week til next visit 7. Choose 1% milk instead of whole milk.  Goal: Get A1C down to 7% in three months.    Teaching Method Utilized:  Visual Auditory Hands on  Handouts given during visit include: The Plate Method Carb Counting and Food Label handouts Meal Plan Card  Barriers to learning/adherence to lifestyle change: none  Demonstrated degree of understanding via:  Teach Back   Monitoring/Evaluation:  Dietary intake, exercise, meal planning, and body weight 1 month.

## 2014-08-28 NOTE — Patient Instructions (Signed)
Plan:  1. Increase exercise to 60 minutes three times per week for needed weight loss. 2. Ask Dr. Nevada Crane to change your lantus to Lantus Pen. 3. Cut out soda--regular and diet sodas Cut out sweets, cakes, coookies and junk food 4. Increase  Low carb vegetables  Add fruit and vegetables to meals. 5. Avoid salty foods. 6. Only Drink water Lose 1 lb per week til next visit 7. Choose 1% milk instead of whole milk.  Goal: Get A1C down to 7% in three months.

## 2014-11-30 ENCOUNTER — Encounter: Payer: 59 | Attending: Internal Medicine | Admitting: Nutrition

## 2014-11-30 ENCOUNTER — Ambulatory Visit: Payer: Self-pay | Admitting: Nutrition

## 2014-11-30 VITALS — Ht 59.0 in | Wt 141.0 lb

## 2014-11-30 DIAGNOSIS — IMO0002 Reserved for concepts with insufficient information to code with codable children: Secondary | ICD-10-CM

## 2014-11-30 DIAGNOSIS — E118 Type 2 diabetes mellitus with unspecified complications: Secondary | ICD-10-CM | POA: Diagnosis present

## 2014-11-30 DIAGNOSIS — E1165 Type 2 diabetes mellitus with hyperglycemia: Secondary | ICD-10-CM

## 2014-11-30 DIAGNOSIS — Z713 Dietary counseling and surveillance: Secondary | ICD-10-CM | POA: Diagnosis not present

## 2014-11-30 DIAGNOSIS — Z794 Long term (current) use of insulin: Secondary | ICD-10-CM | POA: Insufficient documentation

## 2014-11-30 NOTE — Progress Notes (Signed)
  Medical Nutrition Therapy:  Appt start time: 4270 end time: 1500 Assessment:  Primary concerns today: Diabetes.follow up Dm. BS log brought in. BS 100 to 399 mg/dl. She doesn't think she is putting air into syringe correctly and injecting it correctly. Needs a insulin pen for greater acccuracy of insulin administration. Uses the  Prodigy meter which is not preferred under her plan. Will give her a Verio meter that is approved and strips are only $10/100 strips. She is eating better balanced meals. Has cut out sweets and junk food. Weight is down 13 lbs from last visit-possible errored weight last visit. Usual weight is around 140's. Need better consistency with BS and insulin administration. Eating more fresh fruits and vegetables.Needs supplies for verio meter testing. Currently on 40 units of Lantus daily and   Preferred Learning Style:     No preference indicated   Learning Readiness:     Ready  Change in progress  MEDICATIONS:    DIETARY INTAKE:   24-hr recall:   Eating three better balanced meals now. Cut out sweets, ice cream and junk food.  Usual physical activity: walking  Estimated energy needs: 1500 calories 170 g carbohydrates 112 g protein 42 g fat  Progress Towards Goal(s):  In progress.   Nutritional Diagnosis:  Nutrition and diabetes knowledge deficit  As related to  diabetes As evidenced by A1C of 8.1%.    Intervention:  Nutrition Counseling and diabetes education on meal planning and target ranges for blood sugars. Meal planning, portion sizes and benefits of exercise. Plan:  1. Increase exercise to 60 minutes three times per week for needed weight loss. 2. Ask Dr. Nevada Crane to change your lantus to LEVEMIR Pen which is covered under your insurance for only $10 copy(Lantus is $80 copay).  3. Only drink water 4. Increase fresh fruits and vegetables. 5. Walk 30 minutes per day. 6. Ask Dr. Nevada Crane to prescribe strips and testing supplies for oreferred Verio  meter- I gave you the meter.(Strips are $10 a month for 100 strips.) 7. Choose 1% milk instead of whole milk. 8. Get A1C down to 7% in three months  Teaching Method Utilized:  Visual Auditory Hands on  Handouts given during visit include: The Plate Method Carb Counting and Food Label handouts Meal Plan Card  Barriers to learning/adherence to lifestyle change: none  Demonstrated degree of understanding via:  Teach Back   Monitoring/Evaluation:  Dietary intake, exercise, meal planning, and body weight 3 months. Recommend: Levemir pen for greater accuracy of insulin dosing and improved blood sugar control instead of Lantus vial and syringe.. It's Tier 1 preferred on her insurance .                        Needs Verio meter testing supplies. Meter has been given to her. Preferred meter instead of her Prodigy meter that she currently has.

## 2014-11-30 NOTE — Patient Instructions (Signed)
Plan:  1. Increase exercise to 60 minutes three times per week for needed weight loss. 2. Ask Dr. Nevada Crane to change your lantus to LEVEMIR Pen which is covered under your insurance for only $10 copy(Lantus is $80 copay).  3. Only drink water 4. Increase fresh fruits and vegetables. 5. Walk 30 minutes per day. 6. Ask Dr. Nevada Crane to prescribe strips and testing supplies for oreferred Verio meter- I gave you the meter.(Strips are $10 a month for 100 strips.) 7. Choose 1% milk instead of whole milk. 8. Get A1C down to 7% in three months

## 2014-12-22 ENCOUNTER — Other Ambulatory Visit (HOSPITAL_COMMUNITY): Payer: Self-pay | Admitting: Internal Medicine

## 2014-12-22 DIAGNOSIS — Z1231 Encounter for screening mammogram for malignant neoplasm of breast: Secondary | ICD-10-CM

## 2015-01-05 ENCOUNTER — Ambulatory Visit (HOSPITAL_COMMUNITY)
Admission: RE | Admit: 2015-01-05 | Discharge: 2015-01-05 | Disposition: A | Discharge status: Home / Self Care | Payer: 59 | Source: Ambulatory Visit | Attending: Internal Medicine | Admitting: Internal Medicine

## 2015-01-05 DIAGNOSIS — Z1231 Encounter for screening mammogram for malignant neoplasm of breast: Secondary | ICD-10-CM | POA: Diagnosis not present

## 2015-03-08 ENCOUNTER — Other Ambulatory Visit (HOSPITAL_COMMUNITY): Payer: Self-pay | Admitting: Internal Medicine

## 2015-03-08 DIAGNOSIS — R945 Abnormal results of liver function studies: Secondary | ICD-10-CM

## 2015-03-14 ENCOUNTER — Ambulatory Visit (HOSPITAL_COMMUNITY): Payer: 59 | Attending: Internal Medicine

## 2015-03-15 ENCOUNTER — Other Ambulatory Visit (HOSPITAL_COMMUNITY): Payer: Self-pay | Admitting: Internal Medicine

## 2015-03-15 DIAGNOSIS — R945 Abnormal results of liver function studies: Secondary | ICD-10-CM

## 2015-03-21 ENCOUNTER — Ambulatory Visit (HOSPITAL_COMMUNITY)
Admission: RE | Admit: 2015-03-21 | Discharge: 2015-03-21 | Disposition: A | Discharge status: Home / Self Care | Payer: 59 | Source: Ambulatory Visit | Attending: Internal Medicine | Admitting: Internal Medicine

## 2015-03-21 DIAGNOSIS — R945 Abnormal results of liver function studies: Secondary | ICD-10-CM | POA: Insufficient documentation

## 2015-04-19 ENCOUNTER — Encounter (INDEPENDENT_AMBULATORY_CARE_PROVIDER_SITE_OTHER): Payer: Self-pay | Admitting: *Deleted

## 2015-05-23 ENCOUNTER — Ambulatory Visit (INDEPENDENT_AMBULATORY_CARE_PROVIDER_SITE_OTHER): Payer: 59 | Admitting: Internal Medicine

## 2015-05-24 ENCOUNTER — Encounter (INDEPENDENT_AMBULATORY_CARE_PROVIDER_SITE_OTHER): Payer: Self-pay | Admitting: *Deleted

## 2015-06-21 ENCOUNTER — Ambulatory Visit (INDEPENDENT_AMBULATORY_CARE_PROVIDER_SITE_OTHER): Payer: 59 | Admitting: Internal Medicine

## 2015-07-05 ENCOUNTER — Ambulatory Visit (INDEPENDENT_AMBULATORY_CARE_PROVIDER_SITE_OTHER): Payer: 59 | Admitting: Internal Medicine

## 2016-02-21 ENCOUNTER — Encounter (HOSPITAL_COMMUNITY): Payer: Self-pay | Admitting: Cardiology

## 2016-02-21 ENCOUNTER — Emergency Department (HOSPITAL_COMMUNITY)
Admission: EM | Admit: 2016-02-21 | Discharge: 2016-02-21 | Disposition: A | Discharge status: Home / Self Care | Payer: BLUE CROSS/BLUE SHIELD | Attending: Emergency Medicine | Admitting: Emergency Medicine

## 2016-02-21 DIAGNOSIS — Z794 Long term (current) use of insulin: Secondary | ICD-10-CM | POA: Insufficient documentation

## 2016-02-21 DIAGNOSIS — E119 Type 2 diabetes mellitus without complications: Secondary | ICD-10-CM | POA: Diagnosis not present

## 2016-02-21 DIAGNOSIS — Y92410 Unspecified street and highway as the place of occurrence of the external cause: Secondary | ICD-10-CM | POA: Diagnosis not present

## 2016-02-21 DIAGNOSIS — Z79899 Other long term (current) drug therapy: Secondary | ICD-10-CM | POA: Diagnosis not present

## 2016-02-21 DIAGNOSIS — I10 Essential (primary) hypertension: Secondary | ICD-10-CM | POA: Diagnosis not present

## 2016-02-21 DIAGNOSIS — Y999 Unspecified external cause status: Secondary | ICD-10-CM | POA: Insufficient documentation

## 2016-02-21 DIAGNOSIS — M545 Low back pain: Secondary | ICD-10-CM | POA: Diagnosis present

## 2016-02-21 DIAGNOSIS — Y9389 Activity, other specified: Secondary | ICD-10-CM | POA: Diagnosis not present
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
## 2017-03-25 ENCOUNTER — Encounter (INDEPENDENT_AMBULATORY_CARE_PROVIDER_SITE_OTHER): Payer: BLUE CROSS/BLUE SHIELD | Admitting: Ophthalmology

## 2017-03-25 DIAGNOSIS — H43813 Vitreous degeneration, bilateral: Secondary | ICD-10-CM | POA: Diagnosis not present

## 2017-03-25 DIAGNOSIS — H2513 Age-related nuclear cataract, bilateral: Secondary | ICD-10-CM

## 2017-03-25 DIAGNOSIS — E103392 Type 1 diabetes mellitus with moderate nonproliferative diabetic retinopathy without macular edema, left eye: Secondary | ICD-10-CM

## 2017-03-25 DIAGNOSIS — D3131 Benign neoplasm of right choroid: Secondary | ICD-10-CM | POA: Diagnosis not present

## 2017-03-25 DIAGNOSIS — E103311 Type 1 diabetes mellitus with moderate nonproliferative diabetic retinopathy with macular edema, right eye: Secondary | ICD-10-CM | POA: Diagnosis not present

## 2017-03-25 DIAGNOSIS — I10 Essential (primary) hypertension: Secondary | ICD-10-CM | POA: Diagnosis not present

## 2017-03-25 DIAGNOSIS — E10319 Type 1 diabetes mellitus with unspecified diabetic retinopathy without macular edema: Secondary | ICD-10-CM | POA: Diagnosis not present

## 2017-03-25 DIAGNOSIS — H35033 Hypertensive retinopathy, bilateral: Secondary | ICD-10-CM | POA: Diagnosis not present

## 2017-04-22 ENCOUNTER — Encounter (INDEPENDENT_AMBULATORY_CARE_PROVIDER_SITE_OTHER): Payer: BLUE CROSS/BLUE SHIELD | Admitting: Ophthalmology

## 2017-04-22 DIAGNOSIS — D3131 Benign neoplasm of right choroid: Secondary | ICD-10-CM

## 2017-04-22 DIAGNOSIS — I10 Essential (primary) hypertension: Secondary | ICD-10-CM | POA: Diagnosis not present

## 2017-04-22 DIAGNOSIS — E11311 Type 2 diabetes mellitus with unspecified diabetic retinopathy with macular edema: Secondary | ICD-10-CM | POA: Diagnosis not present

## 2017-04-22 DIAGNOSIS — E113292 Type 2 diabetes mellitus with mild nonproliferative diabetic retinopathy without macular edema, left eye: Secondary | ICD-10-CM | POA: Diagnosis not present

## 2017-04-22 DIAGNOSIS — H35033 Hypertensive retinopathy, bilateral: Secondary | ICD-10-CM

## 2017-04-22 DIAGNOSIS — H2513 Age-related nuclear cataract, bilateral: Secondary | ICD-10-CM | POA: Diagnosis not present

## 2017-04-22 DIAGNOSIS — E113211 Type 2 diabetes mellitus with mild nonproliferative diabetic retinopathy with macular edema, right eye: Secondary | ICD-10-CM

## 2017-04-22 DIAGNOSIS — H43813 Vitreous degeneration, bilateral: Secondary | ICD-10-CM

## 2017-04-23 NOTE — Patient Instructions (Signed)
Your procedure is scheduled on: 05/08/2017   Report to [facility] at  700   AM.  Call this number if you have problems the morning of surgery: [phone]   Do not eat food or drink liquids :After Midnight.      Take these medicines the morning of surgery with A SIP OF WATER: levothyroxine. See enclosed instructions about Insulin.   Do not wear jewelry, make-up or nail polish.  Do not wear lotions, powders, or perfumes. You may wear deodorant.  Do not shave 48 hours prior to surgery.  Do not bring valuables to the hospital.  Contacts, dentures or bridgework may not be worn into surgery.  Leave suitcase in the car. After surgery it may be brought to your room.  For patients admitted to the hospital, checkout time is 11:00 AM the day of discharge.   Patients discharged the day of surgery will not be allowed to drive home.  :     Please read over the following fact sheets that you were given: Coughing and Deep Breathing, Surgical Site Infection Prevention, Anesthesia Post-op Instructions and Care and Recovery After Surgery    Cataract A cataract is a clouding of the lens of the eye. When a lens becomes cloudy, vision is reduced based on the degree and nature of the clouding. Many cataracts reduce vision to some degree. Some cataracts make people more near-sighted as they develop. Other cataracts increase glare. Cataracts that are ignored and become worse can sometimes look white. The white color can be seen through the pupil. CAUSES   Aging. However, cataracts may occur at any age, even in newborns.   Certain drugs.   Trauma to the eye.   Certain diseases such as diabetes.   Specific eye diseases such as chronic inflammation inside the eye or a sudden attack of a rare form of glaucoma.   Inherited or acquired medical problems.  SYMPTOMS   Gradual, progressive drop in vision in the affected eye.   Severe, rapid visual loss. This most often happens when trauma is the cause.    DIAGNOSIS  To detect a cataract, an eye doctor examines the lens. Cataracts are best diagnosed with an exam of the eyes with the pupils enlarged (dilated) by drops.  TREATMENT  For an early cataract, vision may improve by using different eyeglasses or stronger lighting. If that does not help your vision, surgery is the only effective treatment. A cataract needs to be surgically removed when vision loss interferes with your everyday activities, such as driving, reading, or watching TV. A cataract may also have to be removed if it prevents examination or treatment of another eye problem. Surgery removes the cloudy lens and usually replaces it with a substitute lens (intraocular lens, IOL).  At a time when both you and your doctor agree, the cataract will be surgically removed. If you have cataracts in both eyes, only one is usually removed at a time. This allows the operated eye to heal and be out of danger from any possible problems after surgery (such as infection or poor wound healing). In rare cases, a cataract may be doing damage to your eye. In these cases, your caregiver may advise surgical removal right away. The vast majority of people who have cataract surgery have better vision afterward. HOME CARE INSTRUCTIONS  If you are not planning surgery, you may be asked to do the following:  Use different eyeglasses.   Use stronger or brighter lighting.   Ask your  eye doctor about reducing your medicine dose or changing medicines if it is thought that a medicine caused your cataract. Changing medicines does not make the cataract go away on its own.   Become familiar with your surroundings. Poor vision can lead to injury. Avoid bumping into things on the affected side. You are at a higher risk for tripping or falling.   Exercise extreme care when driving or operating machinery.   Wear sunglasses if you are sensitive to bright light or experiencing problems with glare.  SEEK IMMEDIATE MEDICAL CARE  IF:   You have a worsening or sudden vision loss.   You notice redness, swelling, or increasing pain in the eye.   You have a fever.  Document Released: 06/09/2005 Document Revised: 05/29/2011 Document Reviewed: 01/31/2011 [facility] Patient Information 2012 [employer].PATIENT INSTRUCTIONS POST-ANESTHESIA  IMMEDIATELY FOLLOWING SURGERY:  Do not drive or operate machinery for the first twenty four hours after surgery.  Do not make any important decisions for twenty four hours after surgery or while taking narcotic pain medications or sedatives.  If you develop intractable nausea and vomiting or a severe headache please notify your doctor immediately.  FOLLOW-UP:  Please make an appointment with your surgeon as instructed. You do not need to follow up with anesthesia unless specifically instructed to do so.  WOUND CARE INSTRUCTIONS (if applicable):  Keep a dry clean dressing on the anesthesia/puncture wound site if there is drainage.  Once the wound has quit draining you may leave it open to air.  Generally you should leave the bandage intact for twenty four hours unless there is drainage.  If the epidural site drains for more than 36-48 hours please call the anesthesia department.  QUESTIONS?:  Please feel free to call your physician or the hospital operator if you have any questions, and they will be happy to assist you.

## 2017-04-30 ENCOUNTER — Encounter (HOSPITAL_COMMUNITY)
Admission: RE | Admit: 2017-04-30 | Discharge: 2017-04-30 | Disposition: A | Discharge status: Home / Self Care | Payer: BLUE CROSS/BLUE SHIELD | Source: Ambulatory Visit | Attending: Ophthalmology | Admitting: Ophthalmology

## 2017-05-04 ENCOUNTER — Other Ambulatory Visit: Payer: Self-pay

## 2017-05-04 ENCOUNTER — Encounter (HOSPITAL_COMMUNITY)
Admission: RE | Admit: 2017-05-04 | Discharge: 2017-05-04 | Disposition: A | Discharge status: Home / Self Care | Payer: BLUE CROSS/BLUE SHIELD | Source: Ambulatory Visit | Attending: Ophthalmology | Admitting: Ophthalmology

## 2017-05-04 ENCOUNTER — Encounter (HOSPITAL_COMMUNITY): Payer: Self-pay

## 2017-05-04 DIAGNOSIS — Z79899 Other long term (current) drug therapy: Secondary | ICD-10-CM | POA: Diagnosis not present

## 2017-05-04 DIAGNOSIS — Z794 Long term (current) use of insulin: Secondary | ICD-10-CM | POA: Diagnosis not present

## 2017-05-04 DIAGNOSIS — Z01818 Encounter for other preprocedural examination: Secondary | ICD-10-CM | POA: Diagnosis not present

## 2017-05-04 DIAGNOSIS — Z01810 Encounter for preprocedural cardiovascular examination: Secondary | ICD-10-CM | POA: Insufficient documentation

## 2017-05-04 DIAGNOSIS — H2511 Age-related nuclear cataract, right eye: Secondary | ICD-10-CM | POA: Diagnosis not present

## 2017-05-04 HISTORY — DX: Hypothyroidism, unspecified: E03.9

## 2017-05-04 LAB — CBC WITH DIFFERENTIAL/PLATELET
Basophils Absolute: 0 10*3/uL (ref 0.0–0.1)
Basophils Relative: 1 %
Eosinophils Absolute: 0 10*3/uL (ref 0.0–0.7)
Eosinophils Relative: 1 %
HCT: 32.4 % — ABNORMAL LOW (ref 36.0–46.0)
Hemoglobin: 11 g/dL — ABNORMAL LOW (ref 12.0–15.0)
Lymphocytes Relative: 31 %
Lymphs Abs: 1.6 10*3/uL (ref 0.7–4.0)
MCH: 31.5 pg (ref 26.0–34.0)
MCHC: 34 g/dL (ref 30.0–36.0)
MCV: 92.8 fL (ref 78.0–100.0)
Monocytes Absolute: 0.4 10*3/uL (ref 0.1–1.0)
Monocytes Relative: 8 %
Neutro Abs: 3 10*3/uL (ref 1.7–7.7)
Neutrophils Relative %: 59 %
Platelets: 334 10*3/uL (ref 150–400)
RBC: 3.49 MIL/uL — ABNORMAL LOW (ref 3.87–5.11)
RDW: 12.5 % (ref 11.5–15.5)
WBC: 5.1 10*3/uL (ref 4.0–10.5)

## 2017-05-04 LAB — BASIC METABOLIC PANEL
Anion gap: 10 (ref 5–15)
BUN: 31 mg/dL — ABNORMAL HIGH (ref 6–20)
CO2: 27 mmol/L (ref 22–32)
Calcium: 9.7 mg/dL (ref 8.9–10.3)
Chloride: 103 mmol/L (ref 101–111)
Creatinine, Ser: 1.58 mg/dL — ABNORMAL HIGH (ref 0.44–1.00)
GFR calc Af Amer: 42 mL/min — ABNORMAL LOW (ref >60)
GFR calc non Af Amer: 37 mL/min — ABNORMAL LOW (ref >60)
Glucose, Bld: 126 mg/dL — ABNORMAL HIGH (ref 65–99)
Potassium: 3.8 mmol/L (ref 3.5–5.1)
Sodium: 140 mmol/L (ref 135–145)

## 2017-05-04 LAB — HEMOGLOBIN A1C
Hgb A1c MFr Bld: 7.1 % — ABNORMAL HIGH (ref 4.8–5.6)
Mean Plasma Glucose: 157.07 mg/dL

## 2017-05-04 LAB — GLUCOSE, CAPILLARY: Glucose-Capillary: 133 mg/dL — ABNORMAL HIGH (ref 65–99)

## 2017-05-05 NOTE — Pre-Procedure Instructions (Signed)
HgbA1C routed to PCP. 

## 2017-05-08 ENCOUNTER — Ambulatory Visit (HOSPITAL_COMMUNITY): Payer: BLUE CROSS/BLUE SHIELD | Admitting: Anesthesiology

## 2017-05-08 ENCOUNTER — Encounter (HOSPITAL_COMMUNITY): Payer: Self-pay | Admitting: *Deleted

## 2017-05-08 ENCOUNTER — Encounter (HOSPITAL_COMMUNITY)
Admission: RE | Disposition: A | Discharge status: Home / Self Care | Payer: Self-pay | Source: Ambulatory Visit | Attending: Ophthalmology

## 2017-05-08 ENCOUNTER — Ambulatory Visit (HOSPITAL_COMMUNITY)
Admission: RE | Admit: 2017-05-08 | Discharge: 2017-05-08 | Disposition: A | Discharge status: Home / Self Care | Payer: BLUE CROSS/BLUE SHIELD | Source: Ambulatory Visit | Attending: Ophthalmology | Admitting: Ophthalmology

## 2017-05-08 DIAGNOSIS — M1990 Unspecified osteoarthritis, unspecified site: Secondary | ICD-10-CM | POA: Diagnosis not present

## 2017-05-08 DIAGNOSIS — H269 Unspecified cataract: Secondary | ICD-10-CM | POA: Insufficient documentation

## 2017-05-08 DIAGNOSIS — E119 Type 2 diabetes mellitus without complications: Secondary | ICD-10-CM | POA: Insufficient documentation

## 2017-05-08 DIAGNOSIS — E039 Hypothyroidism, unspecified: Secondary | ICD-10-CM | POA: Insufficient documentation

## 2017-05-08 DIAGNOSIS — Q969 Turner's syndrome, unspecified: Secondary | ICD-10-CM | POA: Diagnosis not present

## 2017-05-08 DIAGNOSIS — I10 Essential (primary) hypertension: Secondary | ICD-10-CM | POA: Diagnosis not present

## 2017-05-08 HISTORY — PX: CATARACT EXTRACTION W/PHACO: SHX586

## 2017-05-08 LAB — GLUCOSE, CAPILLARY: Glucose-Capillary: 260 mg/dL — ABNORMAL HIGH (ref 65–99)

## 2017-05-08 SURGERY — PHACOEMULSIFICATION, CATARACT, WITH IOL INSERTION
Anesthesia: Monitor Anesthesia Care | Site: Eye | Laterality: Right

## 2017-05-08 MED ORDER — EPINEPHRINE PF 1 MG/ML IJ SOLN
INTRAOCULAR | Status: DC | PRN
  Administered 2017-05-08: 1 mL via OPHTHALMIC

## 2017-05-08 MED ORDER — POVIDONE-IODINE 5 % OP SOLN
OPHTHALMIC | Status: DC | PRN
  Administered 2017-05-08: 1 via OPHTHALMIC

## 2017-05-08 MED ORDER — LACTATED RINGERS IV SOLN
INTRAVENOUS | Status: DC
  Administered 2017-05-08: 08:00:00 via INTRAVENOUS

## 2017-05-08 MED ORDER — LIDOCAINE HCL 3.5 % OP GEL
1.0000 "application " | Freq: Once | OPHTHALMIC | Status: AC
  Administered 2017-05-08: 1 via OPHTHALMIC

## 2017-05-08 MED ORDER — SODIUM HYALURONATE 23 MG/ML IO SOLN
INTRAOCULAR | Status: DC | PRN
  Administered 2017-05-08: 0.6 mL via INTRAOCULAR

## 2017-05-08 MED ORDER — BSS IO SOLN
INTRAOCULAR | Status: DC | PRN
  Administered 2017-05-08: 15 mL

## 2017-05-08 MED ORDER — CYCLOPENTOLATE-PHENYLEPHRINE 0.2-1 % OP SOLN
1.0000 [drp] | OPHTHALMIC | Status: AC
  Administered 2017-05-08 (×3): 1 [drp] via OPHTHALMIC

## 2017-05-08 MED ORDER — PROVISC 10 MG/ML IO SOLN
INTRAOCULAR | Status: DC | PRN
  Administered 2017-05-08: 0.85 mL via INTRAOCULAR

## 2017-05-08 MED ORDER — FENTANYL CITRATE (PF) 100 MCG/2ML IJ SOLN
25.0000 ug | Freq: Once | INTRAMUSCULAR | Status: AC
  Administered 2017-05-08: 25 ug via INTRAVENOUS
  Filled 2017-05-08: qty 2

## 2017-05-08 MED ORDER — TETRACAINE HCL 0.5 % OP SOLN
1.0000 [drp] | OPHTHALMIC | Status: AC
  Administered 2017-05-08 (×3): 1 [drp] via OPHTHALMIC

## 2017-05-08 MED ORDER — MIDAZOLAM HCL 2 MG/2ML IJ SOLN
1.0000 mg | INTRAMUSCULAR | Status: AC
  Administered 2017-05-08: 2 mg via INTRAVENOUS
  Filled 2017-05-08: qty 2

## 2017-05-08 MED ORDER — NEOMYCIN-POLYMYXIN-DEXAMETH 3.5-10000-0.1 OP SUSP
OPHTHALMIC | Status: DC | PRN
  Administered 2017-05-08: 2 [drp] via OPHTHALMIC

## 2017-05-08 MED ORDER — EPINEPHRINE PF 1 MG/ML IJ SOLN
INTRAOCULAR | Status: DC | PRN
  Administered 2017-05-08: 500 mL

## 2017-05-08 MED ORDER — PHENYLEPHRINE HCL 2.5 % OP SOLN
1.0000 [drp] | OPHTHALMIC | Status: AC
  Administered 2017-05-08 (×3): 1 [drp] via OPHTHALMIC

## 2017-05-08 SURGICAL SUPPLY — 14 items
CLOTH BEACON ORANGE TIMEOUT ST (SAFETY) ×2 IMPLANT
EYE SHIELD UNIVERSAL CLEAR (GAUZE/BANDAGES/DRESSINGS) ×2 IMPLANT
GLOVE BIOGEL PI IND STRL 6.5 (GLOVE) ×1 IMPLANT
GLOVE BIOGEL PI IND STRL 7.0 (GLOVE) ×1 IMPLANT
GLOVE BIOGEL PI INDICATOR 6.5 (GLOVE) ×1
GLOVE BIOGEL PI INDICATOR 7.0 (GLOVE) ×1
LENS ALC ACRYL/TECN (Ophthalmic Related) ×2 IMPLANT
NEEDLE HYPO 18GX1.5 BLUNT FILL (NEEDLE) ×2 IMPLANT
PAD ARMBOARD 7.5X6 YLW CONV (MISCELLANEOUS) ×2 IMPLANT
SYR TB 1ML LL NO SAFETY (SYRINGE) ×2 IMPLANT
TAPE SURG TRANSPORE 1 IN (GAUZE/BANDAGES/DRESSINGS) ×1 IMPLANT
TAPE SURGICAL TRANSPORE 1 IN (GAUZE/BANDAGES/DRESSINGS) ×1
VISCOELASTIC ADDITIONAL (OPHTHALMIC RELATED) ×2 IMPLANT
WATER STERILE IRR 250ML POUR (IV SOLUTION) ×2 IMPLANT

## 2017-05-08 NOTE — H&P (Signed)
The H and P was reviewed and updated. The patient was examined.  No changes were found after exam.  The surgical eye was marked.  

## 2017-05-08 NOTE — Anesthesia Preprocedure Evaluation (Signed)
Anesthesia Evaluation  Patient identified by MRN, date of birth, ID band Patient awake    Reviewed: Allergy & Precautions, NPO status , Patient's Chart, lab work & pertinent test results  Airway Mallampati: II  TM Distance: >3 FB Neck ROM: Full    Dental  (+) Teeth Intact   Pulmonary neg pulmonary ROS,    breath sounds clear to auscultation       Cardiovascular hypertension, Pt. on medications  Rhythm:Regular Rate:Normal     Neuro/Psych    GI/Hepatic negative GI ROS, Neg liver ROS,   Endo/Other  diabetes, Type 2Hypothyroidism   Renal/GU negative Renal ROS     Musculoskeletal  (+) Arthritis ,   Abdominal   Peds  Hematology   Anesthesia Other Findings Turner's Syndrome  Reproductive/Obstetrics                             Anesthesia Physical Anesthesia Plan  ASA: III  Anesthesia Plan: MAC   Post-op Pain Management:    Induction: Intravenous  PONV Risk Score and Plan:   Airway Management Planned: Nasal Cannula  Additional Equipment:   Intra-op Plan:   Post-operative Plan:   Informed Consent: I have reviewed the patients History and Physical, chart, labs and discussed the procedure including the risks, benefits and alternatives for the proposed anesthesia with the patient or authorized representative who has indicated his/her understanding and acceptance.     Plan Discussed with:   Anesthesia Plan Comments:         Anesthesia Quick Evaluation  

## 2017-05-08 NOTE — Transfer of Care (Signed)
Immediate Anesthesia Transfer of Care Note  Patient: Sandy Price  Procedure(s) Performed: CATARACT EXTRACTION PHACO AND INTRAOCULAR LENS PLACEMENT (IOC) (Right Eye)  Patient Location: Short Stay  Anesthesia Type:MAC  Level of Consciousness: awake  Airway & Oxygen Therapy: Patient Spontanous Breathing  Post-op Assessment: Report given to RN  Post vital signs: Reviewed  Last Vitals:  Vitals:   05/08/17 0800 05/08/17 0815  BP: (!) 154/86 (!) 144/86  Resp: 14 16  Temp:    SpO2: 100% 100%    Last Pain:  Vitals:   05/08/17 0740  TempSrc: Oral      Patients Stated Pain Goal: 5 (22/48/25 0037)  Complications: No apparent anesthesia complications

## 2017-05-08 NOTE — Anesthesia Postprocedure Evaluation (Signed)
Anesthesia Post Note  Patient: Sandy Price  Procedure(s) Performed: CATARACT EXTRACTION PHACO AND INTRAOCULAR LENS PLACEMENT (IOC) (Right Eye)  Patient location during evaluation: Short Stay Anesthesia Type: MAC Level of consciousness: awake and alert and oriented Pain management: pain level controlled Vital Signs Assessment: post-procedure vital signs reviewed and stable Respiratory status: spontaneous breathing Cardiovascular status: blood pressure returned to baseline Postop Assessment: no apparent nausea or vomiting Anesthetic complications: no     Last Vitals:  Vitals:   05/08/17 0800 05/08/17 0815  BP: (!) 154/86 (!) 144/86  Resp: 14 16  Temp:    SpO2: 100% 100%    Last Pain:  Vitals:   05/08/17 0740  TempSrc: Oral                 Kary Colaizzi

## 2017-05-08 NOTE — Discharge Instructions (Signed)
Please discharge patient when stable, will follow up today with Dr. Wrzosek at the [facility] office immediately following discharge.  Leave shield in place until visit.  All paperwork with discharge instructions will be given at the office. ° ° °Moderate Conscious Sedation, Adult, Care After °These instructions provide you with information about caring for yourself after your procedure. Your health care provider may also give you more specific instructions. Your treatment has been planned according to current medical practices, but problems sometimes occur. Call your health care provider if you have any problems or questions after your procedure. °What can I expect after the procedure? °After your procedure, it is common: °· To feel sleepy for several hours. °· To feel clumsy and have poor balance for several hours. °· To have poor judgment for several hours. °· To vomit if you eat too soon. ° °Follow these instructions at home: °For at least 24 hours after the procedure: ° °· Do not: °? Participate in activities where you could fall or become injured. °? Drive. °? Use heavy machinery. °? Drink alcohol. °? Take sleeping pills or medicines that cause drowsiness. °? Make important decisions or sign legal documents. °? Take care of children on your own. °· Rest. °Eating and drinking °· Follow the diet recommended by your health care provider. °· If you vomit: °? Drink water, juice, or soup when you can drink without vomiting. °? Make sure you have little or no nausea before eating solid foods. °General instructions °· Have a responsible adult stay with you until you are awake and alert. °· Take over-the-counter and prescription medicines only as told by your health care provider. °· If you smoke, do not smoke without supervision. °· Keep all follow-up visits as told by your health care provider. This is important. °Contact a health care provider if: °· You keep feeling nauseous or you keep vomiting. °· You  feel light-headed. °· You develop a rash. °· You have a fever. °Get help right away if: °· You have trouble breathing. °This information is not intended to replace advice given to you by your health care provider. Make sure you discuss any questions you have with your health care provider. °Document Released: 03/30/2013 Document Revised: 11/12/2015 Document Reviewed: 09/29/2015 °Elsevier Interactive Patient Education © 2018 Elsevier Inc[employer] ° ° °

## 2017-05-08 NOTE — Op Note (Signed)
Date of procedure: 05/08/17  Pre-operative diagnosis: Visually significant cataract, Right Eye  Post-operative diagnosis: Visually significant cataract, Right Eye  Procedure: Removal of cataract via phacoemulsification and insertion of intra-ocular lens AMO PCB00  +25.5D into the capsular bag of the Right Eye  Attending surgeon: Gerda Diss. Leanny Moeckel, MD, MA  Anesthesia: MAC, Topical Akten  Complications: None  Estimated Blood Loss: <101m (minimal)  Specimens: None  Implants: As above  Indications:  Visually significant cataract, Right Eye  Procedure:  The patient was seen and identified in the pre-operative area. The operative eye was identified and dilated.  The operative eye was marked.  Topical anesthesia was administered to the operative eye.     The patient was then to the operative suite and placed in the supine position.  A timeout was performed confirming the patient, procedure to be performed, and all other relevant information.   The patient's face was prepped and draped in the usual fashion for intra-ocular surgery.  A lid speculum was placed into the operative eye and the surgical microscope moved into place and focused.  A superotemporal paracentesis was created using a 20 gauge paracentesis blade.  Shugarcaine was injected into the anterior chamber.  Viscoelastic was injected into the anterior chamber.  A temporal clear-corneal main wound incision was created using a 2.453mmicrokeratome.  A continuous curvilinear capsulorrhexis was initiated using an irrigating cystitome and completed using capsulorrhexis forceps.  Hydrodissection and hydrodeliniation were performed.  Viscoelastic was injected into the anterior chamber.  A phacoemulsification handpiece and a chopper as a second instrument were used to remove the nucleus and epinucleus. The irrigation/aspiration handpiece was used to remove any remaining cortical material.   The capsular bag was reinflated with viscoelastic,  checked, and found to be intact.  The intraocular lens was inserted into the capsular bag and dialed into place using a Kuglen hook.  The irrigation/aspiration handpiece was used to remove any remaining viscoelastic.  The clear corneal wound and paracentesis wounds were then hydrated and checked with Weck-Cels to be watertight.  The lid-speculum and drape was removed, and the patient's face was cleaned with a wet and dry 4x4.  Maxitrol was instilled in the eye before a clear shield was taped over the eye. The patient was taken to the post-operative care unit in good condition, having tolerated the procedure well.  Post-Op Instructions: [facility] and will receive all other orders and instructions.

## 2017-05-11 ENCOUNTER — Encounter (HOSPITAL_COMMUNITY): Payer: Self-pay | Admitting: Ophthalmology

## 2017-05-20 ENCOUNTER — Encounter (INDEPENDENT_AMBULATORY_CARE_PROVIDER_SITE_OTHER): Payer: BLUE CROSS/BLUE SHIELD | Admitting: Ophthalmology

## 2017-05-20 DIAGNOSIS — H43813 Vitreous degeneration, bilateral: Secondary | ICD-10-CM

## 2017-05-20 DIAGNOSIS — E113311 Type 2 diabetes mellitus with moderate nonproliferative diabetic retinopathy with macular edema, right eye: Secondary | ICD-10-CM | POA: Diagnosis not present

## 2017-05-20 DIAGNOSIS — D3131 Benign neoplasm of right choroid: Secondary | ICD-10-CM

## 2017-05-20 DIAGNOSIS — E113292 Type 2 diabetes mellitus with mild nonproliferative diabetic retinopathy without macular edema, left eye: Secondary | ICD-10-CM | POA: Diagnosis not present

## 2017-05-20 DIAGNOSIS — H35033 Hypertensive retinopathy, bilateral: Secondary | ICD-10-CM

## 2017-05-20 DIAGNOSIS — H2512 Age-related nuclear cataract, left eye: Secondary | ICD-10-CM | POA: Diagnosis not present

## 2017-05-20 DIAGNOSIS — I10 Essential (primary) hypertension: Secondary | ICD-10-CM | POA: Diagnosis not present

## 2017-05-20 DIAGNOSIS — E11311 Type 2 diabetes mellitus with unspecified diabetic retinopathy with macular edema: Secondary | ICD-10-CM

## 2017-06-26 ENCOUNTER — Encounter (INDEPENDENT_AMBULATORY_CARE_PROVIDER_SITE_OTHER): Payer: BLUE CROSS/BLUE SHIELD | Admitting: Ophthalmology

## 2017-06-26 DIAGNOSIS — D3131 Benign neoplasm of right choroid: Secondary | ICD-10-CM

## 2017-06-26 DIAGNOSIS — E11311 Type 2 diabetes mellitus with unspecified diabetic retinopathy with macular edema: Secondary | ICD-10-CM | POA: Diagnosis not present

## 2017-06-26 DIAGNOSIS — E113311 Type 2 diabetes mellitus with moderate nonproliferative diabetic retinopathy with macular edema, right eye: Secondary | ICD-10-CM

## 2017-06-26 DIAGNOSIS — I10 Essential (primary) hypertension: Secondary | ICD-10-CM | POA: Diagnosis not present

## 2017-06-26 DIAGNOSIS — E113292 Type 2 diabetes mellitus with mild nonproliferative diabetic retinopathy without macular edema, left eye: Secondary | ICD-10-CM

## 2017-06-26 DIAGNOSIS — H43813 Vitreous degeneration, bilateral: Secondary | ICD-10-CM

## 2017-06-26 DIAGNOSIS — H35033 Hypertensive retinopathy, bilateral: Secondary | ICD-10-CM | POA: Diagnosis not present

## 2017-07-06 ENCOUNTER — Encounter (HOSPITAL_COMMUNITY): Payer: Self-pay

## 2017-07-06 ENCOUNTER — Encounter (HOSPITAL_COMMUNITY)
Admission: RE | Admit: 2017-07-06 | Discharge: 2017-07-06 | Disposition: A | Discharge status: Home / Self Care | Payer: BLUE CROSS/BLUE SHIELD | Source: Ambulatory Visit | Attending: Ophthalmology | Admitting: Ophthalmology

## 2017-07-10 ENCOUNTER — Encounter (HOSPITAL_COMMUNITY): Payer: Self-pay | Admitting: *Deleted

## 2017-07-10 ENCOUNTER — Encounter (HOSPITAL_COMMUNITY)
Admission: RE | Disposition: A | Discharge status: Home / Self Care | Payer: Self-pay | Source: Ambulatory Visit | Attending: Ophthalmology

## 2017-07-10 ENCOUNTER — Ambulatory Visit (HOSPITAL_COMMUNITY)
Admission: RE | Admit: 2017-07-10 | Discharge: 2017-07-10 | Disposition: A | Discharge status: Home / Self Care | Payer: BLUE CROSS/BLUE SHIELD | Source: Ambulatory Visit | Attending: Ophthalmology | Admitting: Ophthalmology

## 2017-07-10 ENCOUNTER — Ambulatory Visit (HOSPITAL_COMMUNITY): Payer: BLUE CROSS/BLUE SHIELD | Admitting: Anesthesiology

## 2017-07-10 DIAGNOSIS — Z79899 Other long term (current) drug therapy: Secondary | ICD-10-CM | POA: Insufficient documentation

## 2017-07-10 DIAGNOSIS — E119 Type 2 diabetes mellitus without complications: Secondary | ICD-10-CM | POA: Insufficient documentation

## 2017-07-10 DIAGNOSIS — Z7984 Long term (current) use of oral hypoglycemic drugs: Secondary | ICD-10-CM | POA: Insufficient documentation

## 2017-07-10 DIAGNOSIS — H269 Unspecified cataract: Secondary | ICD-10-CM | POA: Diagnosis present

## 2017-07-10 DIAGNOSIS — I10 Essential (primary) hypertension: Secondary | ICD-10-CM | POA: Insufficient documentation

## 2017-07-10 DIAGNOSIS — H25812 Combined forms of age-related cataract, left eye: Secondary | ICD-10-CM | POA: Diagnosis not present

## 2017-07-10 HISTORY — PX: CATARACT EXTRACTION W/PHACO: SHX586

## 2017-07-10 LAB — GLUCOSE, CAPILLARY: Glucose-Capillary: 148 mg/dL — ABNORMAL HIGH (ref 65–99)

## 2017-07-10 SURGERY — PHACOEMULSIFICATION, CATARACT, WITH IOL INSERTION
Anesthesia: Monitor Anesthesia Care | Site: Eye | Laterality: Left

## 2017-07-10 MED ORDER — MIDAZOLAM HCL 2 MG/2ML IJ SOLN
1.0000 mg | INTRAMUSCULAR | Status: AC
  Administered 2017-07-10: 2 mg via INTRAVENOUS

## 2017-07-10 MED ORDER — PHENYLEPHRINE HCL 2.5 % OP SOLN
1.0000 [drp] | OPHTHALMIC | Status: AC
  Administered 2017-07-10 (×3): 1 [drp] via OPHTHALMIC

## 2017-07-10 MED ORDER — SODIUM HYALURONATE 23 MG/ML IO SOLN
INTRAOCULAR | Status: DC | PRN
  Administered 2017-07-10: 0.6 mL via INTRAOCULAR

## 2017-07-10 MED ORDER — MIDAZOLAM HCL 2 MG/2ML IJ SOLN
INTRAMUSCULAR | Status: AC
  Filled 2017-07-10: qty 2

## 2017-07-10 MED ORDER — NEOMYCIN-POLYMYXIN-DEXAMETH 3.5-10000-0.1 OP SUSP
OPHTHALMIC | Status: DC | PRN
  Administered 2017-07-10: 2 [drp] via OPHTHALMIC

## 2017-07-10 MED ORDER — PROVISC 10 MG/ML IO SOLN
INTRAOCULAR | Status: DC | PRN
  Administered 2017-07-10: 0.85 mL via INTRAOCULAR

## 2017-07-10 MED ORDER — BSS IO SOLN
INTRAOCULAR | Status: DC | PRN
  Administered 2017-07-10: 15 mL

## 2017-07-10 MED ORDER — LIDOCAINE HCL (PF) 1 % IJ SOLN
INTRAOCULAR | Status: DC | PRN
  Administered 2017-07-10: 1 mL via OPHTHALMIC

## 2017-07-10 MED ORDER — FENTANYL CITRATE (PF) 100 MCG/2ML IJ SOLN
INTRAMUSCULAR | Status: AC
  Filled 2017-07-10: qty 2

## 2017-07-10 MED ORDER — POVIDONE-IODINE 5 % OP SOLN
OPHTHALMIC | Status: DC | PRN
  Administered 2017-07-10: 1 via OPHTHALMIC

## 2017-07-10 MED ORDER — CYCLOPENTOLATE-PHENYLEPHRINE 0.2-1 % OP SOLN
1.0000 [drp] | OPHTHALMIC | Status: AC
  Administered 2017-07-10 (×3): 1 [drp] via OPHTHALMIC

## 2017-07-10 MED ORDER — FENTANYL CITRATE (PF) 100 MCG/2ML IJ SOLN
25.0000 ug | Freq: Once | INTRAMUSCULAR | Status: AC
  Administered 2017-07-10: 25 ug via INTRAVENOUS

## 2017-07-10 MED ORDER — EPINEPHRINE PF 1 MG/ML IJ SOLN
INTRAOCULAR | Status: DC | PRN
  Administered 2017-07-10: 500 mL

## 2017-07-10 MED ORDER — LIDOCAINE HCL 3.5 % OP GEL
1.0000 "application " | Freq: Once | OPHTHALMIC | Status: AC
  Administered 2017-07-10: 1 via OPHTHALMIC

## 2017-07-10 MED ORDER — TETRACAINE HCL 0.5 % OP SOLN
1.0000 [drp] | OPHTHALMIC | Status: AC
  Administered 2017-07-10 (×3): 1 [drp] via OPHTHALMIC

## 2017-07-10 MED ORDER — LACTATED RINGERS IV SOLN
INTRAVENOUS | Status: DC
  Administered 2017-07-10: 07:00:00 via INTRAVENOUS

## 2017-07-10 SURGICAL SUPPLY — 12 items
CLOTH BEACON ORANGE TIMEOUT ST (SAFETY) ×2 IMPLANT
EYE SHIELD UNIVERSAL CLEAR (GAUZE/BANDAGES/DRESSINGS) ×2 IMPLANT
GLOVE BIOGEL PI IND STRL 6.5 (GLOVE) ×2 IMPLANT
GLOVE BIOGEL PI INDICATOR 6.5 (GLOVE) ×2
LENS ALC ACRYL/TECN (Ophthalmic Related) ×2 IMPLANT
NEEDLE HYPO 18GX1.5 BLUNT FILL (NEEDLE) ×2 IMPLANT
PAD ARMBOARD 7.5X6 YLW CONV (MISCELLANEOUS) ×2 IMPLANT
SYR TB 1ML LL NO SAFETY (SYRINGE) ×2 IMPLANT
TAPE SURG TRANSPORE 1 IN (GAUZE/BANDAGES/DRESSINGS) ×1 IMPLANT
TAPE SURGICAL TRANSPORE 1 IN (GAUZE/BANDAGES/DRESSINGS) ×1
VISCOELASTIC ADDITIONAL (OPHTHALMIC RELATED) ×2 IMPLANT
WATER STERILE IRR 250ML POUR (IV SOLUTION) ×2 IMPLANT

## 2017-07-10 NOTE — Op Note (Signed)
Date of procedure: 07/10/17  Pre-operative diagnosis: Visually significant cataract, Left Eye  Post-operative diagnosis: Visually significant cataract, Left Eye  Procedure: Removal of cataract via phacoemulsification and insertion of intra-ocular lens AMO PCB00  +25.5D into the capsular bag of the Left Eye  Attending surgeon: Gerda Diss. Lilygrace Rodick, MD, MA  Anesthesia: MAC, Topical Akten  Complications: None  Estimated Blood Loss: <54m (minimal)  Specimens: None  Implants: As above  Indications:  Visually significant cataract, Left Eye  Procedure:  The patient was seen and identified in the pre-operative area. The operative eye was identified and dilated.  The operative eye was marked.  Topical anesthesia was administered to the operative eye.     The patient was then to the operative suite and placed in the supine position.  A timeout was performed confirming the patient, procedure to be performed, and all other relevant information.   The patient's face was prepped and draped in the usual fashion for intra-ocular surgery.  A lid speculum was placed into the operative eye and the surgical microscope moved into place and focused.  A superotemporal paracentesis was created using a 20 gauge paracentesis blade.  Shugarcaine was injected into the anterior chamber.  Viscoelastic was injected into the anterior chamber.  A temporal clear-corneal main wound incision was created using a 2.465mmicrokeratome.  A continuous curvilinear capsulorrhexis was initiated using an irrigating cystitome and completed using capsulorrhexis forceps.  Hydrodissection and hydrodeliniation were performed.  Viscoelastic was injected into the anterior chamber.  A phacoemulsification handpiece and a chopper as a second instrument were used to remove the nucleus and epinucleus. The irrigation/aspiration handpiece was used to remove any remaining cortical material.   The capsular bag was reinflated with viscoelastic, checked,  and found to be intact.  The intraocular lens was inserted into the capsular bag and dialed into place using a Kuglen hook.  A small epithelial defect created during the lens insertion, approximately 44m68m.0.5mm57mhe irrigation/aspiration handpiece was used to remove any remaining viscoelastic.  The clear corneal wound and paracentesis wounds were then hydrated and checked with Weck-Cels to be watertight.  The lid-speculum and drape was removed, and the patient's face was cleaned with a wet and dry 4x4.  Maxitrol was instilled in the eye before a clear shield was taped over the eye. The patient was taken to the post-operative care unit in good condition, having tolerated the procedure well.  Post-Op Instructions: The patient will follow up at [facility] a same day post-operative evaluation and will receive all other orders and instructions.

## 2017-07-10 NOTE — Anesthesia Procedure Notes (Signed)
Procedure Name: MAC Date/Time: 07/10/2017 7:27 AM Performed by: Vista Deck, CRNA Pre-anesthesia Checklist: Patient identified, Emergency Drugs available, Suction available, Timeout performed and Patient being monitored Patient Re-evaluated:Patient Re-evaluated prior to induction Oxygen Delivery Method: Nasal Cannula

## 2017-07-10 NOTE — Anesthesia Preprocedure Evaluation (Signed)
Anesthesia Evaluation  Patient identified by MRN, date of birth, ID band Patient awake    Reviewed: Allergy & Precautions, NPO status , Patient's Chart, lab work & pertinent test results  Airway Mallampati: II  TM Distance: >3 FB Neck ROM: Full    Dental  (+) Teeth Intact   Pulmonary neg pulmonary ROS,    breath sounds clear to auscultation       Cardiovascular hypertension, Pt. on medications  Rhythm:Regular Rate:Normal     Neuro/Psych    GI/Hepatic negative GI ROS, Neg liver ROS,   Endo/Other  diabetes, Type 2Hypothyroidism   Renal/GU negative Renal ROS     Musculoskeletal  (+) Arthritis ,   Abdominal   Peds  Hematology   Anesthesia Other Findings Turner's Syndrome  Reproductive/Obstetrics                             Anesthesia Physical Anesthesia Plan  ASA: III  Anesthesia Plan: MAC   Post-op Pain Management:    Induction: Intravenous  PONV Risk Score and Plan:   Airway Management Planned: Nasal Cannula  Additional Equipment:   Intra-op Plan:   Post-operative Plan:   Informed Consent: I have reviewed the patients History and Physical, chart, labs and discussed the procedure including the risks, benefits and alternatives for the proposed anesthesia with the patient or authorized representative who has indicated his/her understanding and acceptance.     Plan Discussed with:   Anesthesia Plan Comments:         Anesthesia Quick Evaluation

## 2017-07-10 NOTE — Transfer of Care (Signed)
Immediate Anesthesia Transfer of Care Note  Patient: Sandy Price  Procedure(s) Performed: CATARACT EXTRACTION PHACO AND INTRAOCULAR LENS PLACEMENT (IOC) (Left Eye)  Patient Location: Short Stay  Anesthesia Type:MAC  Level of Consciousness: awake, alert  and patient cooperative  Airway & Oxygen Therapy: Patient Spontanous Breathing  Post-op Assessment: Report given to RN and Post -op Vital signs reviewed and stable  Post vital signs: Reviewed and stable  Last Vitals:  Vitals:   07/10/17 0658 07/10/17 0700  BP:  109/77  Resp:  15  Temp: 36.6 C   SpO2:  100%    Last Pain:  Vitals:   07/10/17 0658  TempSrc: Oral      Patients Stated Pain Goal: 8 (57/84/69 6295)  Complications: No apparent anesthesia complications

## 2017-07-10 NOTE — H&P (Signed)
The H and P was reviewed and updated. The patient was examined.  No changes were found after exam.  The surgical eye was marked.  

## 2017-07-10 NOTE — Discharge Instructions (Signed)
Please discharge patient when stable, will follow up today with Dr. Marisa Hua at the [facility] office immediately following discharge.  Leave shield in place until visit.  All paperwork with discharge instructions will be given at the office.   PATIENT INSTRUCTIONS POST-ANESTHESIA  IMMEDIATELY FOLLOWING SURGERY:  Do not drive or operate machinery for the first twenty four hours after surgery.  Do not make any important decisions for twenty four hours after surgery or while taking narcotic pain medications or sedatives.  If you develop intractable nausea and vomiting or a severe headache please notify your doctor immediately.  FOLLOW-UP:  Please make an appointment with your surgeon as instructed. You do not need to follow up with anesthesia unless specifically instructed to do so.  WOUND CARE INSTRUCTIONS (if applicable):  Keep a dry clean dressing on the anesthesia/puncture wound site if there is drainage.  Once the wound has quit draining you may leave it open to air.  Generally you should leave the bandage intact for twenty four hours unless there is drainage.  If the epidural site drains for more than 36-48 hours please call the anesthesia department.  QUESTIONS?:  Please feel free to call your physician or the hospital operator if you have any questions, and they will be happy to assist you.      Cataract Surgery, Care After Refer to this sheet in the next few weeks. These instructions provide you with information about caring for yourself after your procedure. Your health care provider may also give you more specific instructions. Your treatment has been planned according to current medical practices, but problems sometimes occur. Call your health care provider if you have any problems or questions after your procedure. What can I expect after the procedure? After the procedure, it is common to have:  Itching.  Discomfort.  Fluid discharge.  Sensitivity to light and to  touch.  Bruising.  Follow these instructions at home: [place] your eye every day for signs of infection. Watch for: ? Redness, swelling, or pain. ? Fluid, blood, or pus. ? Warmth. ? Bad smell. Activity  Avoid strenuous activities, such as playing contact sports, for as long as told by your health care provider.  Do not drive or operate heavy machinery until your health care provider approves.  Do not bend or lift heavy objects. Bending increases pressure in the eye. You can walk, climb stairs, and do light household chores.  Ask your health care provider when you can return to work. If you work in a dusty environment, you may be advised to wear protective eyewear for a period of time. General instructions  Take or apply over-the-counter and prescription medicines only as told by your health care provider. This includes eye drops.  Do not touch or rub your eyes.  If you were given a protective shield, wear it as told by your health care provider. If you were not given a protective shield, wear sunglasses as told by your health care provider to protect your eyes.  Keep the area around your eye clean and dry. Avoid swimming or allowing water to hit you directly in the face while showering until told by your health care provider. Keep soap and shampoo out of your eyes.  Do not put a contact lens into the affected eye or eyes until your health care provider approves.  Keep all follow-up visits as told by your health care provider. This is important. Contact a health care provider if:  You have increased bruising around your eye.  You have pain that is not helped with medicine.  You have a fever.  You have redness, swelling, or pain in your eye.  You have fluid, blood, or pus coming from your incision.  Your vision gets worse. Get help right away if:  You have sudden vision loss. This information is not intended to replace advice given to you by your health care  provider. Make sure you discuss any questions you have with your health care provider. Document Released: 12/27/2004 Document Revised: 10/18/2015 Document Reviewed: 04/19/2015 Elsevier Interactive Patient Education  [employer].

## 2017-07-10 NOTE — Anesthesia Postprocedure Evaluation (Signed)
Anesthesia Post Note  Patient: Sandy Price  Procedure(s) Performed: CATARACT EXTRACTION PHACO AND INTRAOCULAR LENS PLACEMENT (IOC) (Left Eye)  Patient location during evaluation: Short Stay Anesthesia Type: MAC Level of consciousness: awake and alert and patient cooperative Pain management: satisfactory to patient Vital Signs Assessment: post-procedure vital signs reviewed and stable Respiratory status: spontaneous breathing Cardiovascular status: stable Postop Assessment: no apparent nausea or vomiting Anesthetic complications: no     Last Vitals:  Vitals:   07/10/17 0700 07/10/17 0754  BP: 109/77 134/84  Resp: 15 18  Temp:  36.9 C  SpO2: 100% 99%    Last Pain:  Vitals:   07/10/17 0754  TempSrc: Oral                 Chord Takahashi

## 2017-07-13 ENCOUNTER — Encounter (HOSPITAL_COMMUNITY): Payer: Self-pay | Admitting: Ophthalmology

## 2017-07-30 ENCOUNTER — Encounter (INDEPENDENT_AMBULATORY_CARE_PROVIDER_SITE_OTHER): Payer: BLUE CROSS/BLUE SHIELD | Admitting: Ophthalmology

## 2017-07-30 DIAGNOSIS — H43813 Vitreous degeneration, bilateral: Secondary | ICD-10-CM

## 2017-07-30 DIAGNOSIS — E113213 Type 2 diabetes mellitus with mild nonproliferative diabetic retinopathy with macular edema, bilateral: Secondary | ICD-10-CM | POA: Diagnosis not present

## 2017-07-30 DIAGNOSIS — I10 Essential (primary) hypertension: Secondary | ICD-10-CM | POA: Diagnosis not present

## 2017-07-30 DIAGNOSIS — D3131 Benign neoplasm of right choroid: Secondary | ICD-10-CM | POA: Diagnosis not present

## 2017-07-30 DIAGNOSIS — E11311 Type 2 diabetes mellitus with unspecified diabetic retinopathy with macular edema: Secondary | ICD-10-CM

## 2017-07-30 DIAGNOSIS — H35033 Hypertensive retinopathy, bilateral: Secondary | ICD-10-CM | POA: Diagnosis not present

## 2017-09-09 ENCOUNTER — Encounter (INDEPENDENT_AMBULATORY_CARE_PROVIDER_SITE_OTHER): Payer: BLUE CROSS/BLUE SHIELD | Admitting: Ophthalmology

## 2017-09-17 ENCOUNTER — Encounter (INDEPENDENT_AMBULATORY_CARE_PROVIDER_SITE_OTHER): Payer: BLUE CROSS/BLUE SHIELD | Admitting: Ophthalmology

## 2017-09-17 DIAGNOSIS — I10 Essential (primary) hypertension: Secondary | ICD-10-CM | POA: Diagnosis not present

## 2017-09-17 DIAGNOSIS — E11311 Type 2 diabetes mellitus with unspecified diabetic retinopathy with macular edema: Secondary | ICD-10-CM

## 2017-09-17 DIAGNOSIS — H35033 Hypertensive retinopathy, bilateral: Secondary | ICD-10-CM | POA: Diagnosis not present

## 2017-09-17 DIAGNOSIS — H43813 Vitreous degeneration, bilateral: Secondary | ICD-10-CM | POA: Diagnosis not present

## 2017-09-17 DIAGNOSIS — D3131 Benign neoplasm of right choroid: Secondary | ICD-10-CM | POA: Diagnosis not present

## 2017-09-17 DIAGNOSIS — E113213 Type 2 diabetes mellitus with mild nonproliferative diabetic retinopathy with macular edema, bilateral: Secondary | ICD-10-CM

## 2017-10-29 ENCOUNTER — Encounter (INDEPENDENT_AMBULATORY_CARE_PROVIDER_SITE_OTHER): Payer: BLUE CROSS/BLUE SHIELD | Admitting: Ophthalmology

## 2017-10-29 DIAGNOSIS — H35372 Puckering of macula, left eye: Secondary | ICD-10-CM

## 2017-10-29 DIAGNOSIS — E113213 Type 2 diabetes mellitus with mild nonproliferative diabetic retinopathy with macular edema, bilateral: Secondary | ICD-10-CM | POA: Diagnosis not present

## 2017-10-29 DIAGNOSIS — I10 Essential (primary) hypertension: Secondary | ICD-10-CM | POA: Diagnosis not present

## 2017-10-29 DIAGNOSIS — E11311 Type 2 diabetes mellitus with unspecified diabetic retinopathy with macular edema: Secondary | ICD-10-CM

## 2017-10-29 DIAGNOSIS — D3131 Benign neoplasm of right choroid: Secondary | ICD-10-CM | POA: Diagnosis not present

## 2017-10-29 DIAGNOSIS — H35033 Hypertensive retinopathy, bilateral: Secondary | ICD-10-CM | POA: Diagnosis not present

## 2017-10-29 DIAGNOSIS — H43813 Vitreous degeneration, bilateral: Secondary | ICD-10-CM

## 2017-12-17 ENCOUNTER — Encounter (INDEPENDENT_AMBULATORY_CARE_PROVIDER_SITE_OTHER): Payer: BLUE CROSS/BLUE SHIELD | Admitting: Ophthalmology

## 2018-01-20 ENCOUNTER — Other Ambulatory Visit: Payer: Self-pay | Admitting: Internal Medicine

## 2018-01-20 DIAGNOSIS — Z1231 Encounter for screening mammogram for malignant neoplasm of breast: Secondary | ICD-10-CM

## 2018-01-28 ENCOUNTER — Encounter: Payer: Self-pay | Admitting: Gastroenterology

## 2018-02-01 ENCOUNTER — Encounter (INDEPENDENT_AMBULATORY_CARE_PROVIDER_SITE_OTHER): Payer: BLUE CROSS/BLUE SHIELD | Admitting: Ophthalmology

## 2018-02-01 DIAGNOSIS — D3131 Benign neoplasm of right choroid: Secondary | ICD-10-CM

## 2018-02-01 DIAGNOSIS — I10 Essential (primary) hypertension: Secondary | ICD-10-CM

## 2018-02-01 DIAGNOSIS — E103313 Type 1 diabetes mellitus with moderate nonproliferative diabetic retinopathy with macular edema, bilateral: Secondary | ICD-10-CM | POA: Diagnosis not present

## 2018-02-01 DIAGNOSIS — H35033 Hypertensive retinopathy, bilateral: Secondary | ICD-10-CM

## 2018-02-01 DIAGNOSIS — H35372 Puckering of macula, left eye: Secondary | ICD-10-CM | POA: Diagnosis not present

## 2018-02-01 DIAGNOSIS — E10311 Type 1 diabetes mellitus with unspecified diabetic retinopathy with macular edema: Secondary | ICD-10-CM

## 2018-02-01 DIAGNOSIS — H43813 Vitreous degeneration, bilateral: Secondary | ICD-10-CM

## 2018-03-01 ENCOUNTER — Telehealth: Payer: Self-pay

## 2018-03-01 ENCOUNTER — Ambulatory Visit: Payer: BLUE CROSS/BLUE SHIELD

## 2018-03-01 NOTE — Telephone Encounter (Signed)
noted 

## 2018-03-01 NOTE — Telephone Encounter (Signed)
Patient was a no show and letter sent  °

## 2018-03-08 ENCOUNTER — Ambulatory Visit (HOSPITAL_COMMUNITY)
Admission: RE | Admit: 2018-03-08 | Discharge: 2018-03-08 | Disposition: A | Discharge status: Home / Self Care | Payer: BLUE CROSS/BLUE SHIELD | Source: Ambulatory Visit | Attending: Internal Medicine | Admitting: Internal Medicine

## 2018-03-08 DIAGNOSIS — Z1231 Encounter for screening mammogram for malignant neoplasm of breast: Secondary | ICD-10-CM | POA: Diagnosis present

## 2018-03-15 ENCOUNTER — Encounter (INDEPENDENT_AMBULATORY_CARE_PROVIDER_SITE_OTHER): Payer: BLUE CROSS/BLUE SHIELD | Admitting: Ophthalmology

## 2018-03-15 DIAGNOSIS — E113312 Type 2 diabetes mellitus with moderate nonproliferative diabetic retinopathy with macular edema, left eye: Secondary | ICD-10-CM

## 2018-03-15 DIAGNOSIS — E11311 Type 2 diabetes mellitus with unspecified diabetic retinopathy with macular edema: Secondary | ICD-10-CM | POA: Diagnosis not present

## 2018-03-15 DIAGNOSIS — H35033 Hypertensive retinopathy, bilateral: Secondary | ICD-10-CM

## 2018-03-15 DIAGNOSIS — E113291 Type 2 diabetes mellitus with mild nonproliferative diabetic retinopathy without macular edema, right eye: Secondary | ICD-10-CM | POA: Diagnosis not present

## 2018-03-15 DIAGNOSIS — D3131 Benign neoplasm of right choroid: Secondary | ICD-10-CM

## 2018-03-15 DIAGNOSIS — I10 Essential (primary) hypertension: Secondary | ICD-10-CM | POA: Diagnosis not present

## 2018-04-14 ENCOUNTER — Ambulatory Visit: Payer: BLUE CROSS/BLUE SHIELD

## 2018-04-26 ENCOUNTER — Encounter (INDEPENDENT_AMBULATORY_CARE_PROVIDER_SITE_OTHER): Payer: BLUE CROSS/BLUE SHIELD | Admitting: Ophthalmology

## 2018-04-26 DIAGNOSIS — H43813 Vitreous degeneration, bilateral: Secondary | ICD-10-CM

## 2018-04-26 DIAGNOSIS — E113312 Type 2 diabetes mellitus with moderate nonproliferative diabetic retinopathy with macular edema, left eye: Secondary | ICD-10-CM

## 2018-04-26 DIAGNOSIS — D3131 Benign neoplasm of right choroid: Secondary | ICD-10-CM

## 2018-04-26 DIAGNOSIS — I10 Essential (primary) hypertension: Secondary | ICD-10-CM

## 2018-04-26 DIAGNOSIS — E11311 Type 2 diabetes mellitus with unspecified diabetic retinopathy with macular edema: Secondary | ICD-10-CM | POA: Diagnosis not present

## 2018-04-26 DIAGNOSIS — H35033 Hypertensive retinopathy, bilateral: Secondary | ICD-10-CM

## 2018-04-26 DIAGNOSIS — E113211 Type 2 diabetes mellitus with mild nonproliferative diabetic retinopathy with macular edema, right eye: Secondary | ICD-10-CM

## 2018-04-28 ENCOUNTER — Ambulatory Visit (INDEPENDENT_AMBULATORY_CARE_PROVIDER_SITE_OTHER): Payer: Self-pay

## 2018-04-28 DIAGNOSIS — Z1211 Encounter for screening for malignant neoplasm of colon: Secondary | ICD-10-CM

## 2018-04-28 MED ORDER — PEG 3350-KCL-NA BICARB-NACL 420 G PO SOLR: 4000.0000 mL | ORAL | 0 refills | Status: AC

## 2018-04-28 NOTE — Progress Notes (Signed)
Okay to schedule.  Day before colonoscopy: Metformin half tablet twice daily, insulin glargine-lixisenatide25 units daily A.m. colonoscopy: Hold metformin and insulin glargine-lixisenatide

## 2018-04-28 NOTE — Progress Notes (Signed)
Gastroenterology Pre-Procedure Review  Request Date:04/28/18 Requesting Physician: Inetta Fermo no previous tcs  PATIENT REVIEW QUESTIONS: The patient responded to the following health history questions as indicated:    1. Diabetes Melitis: yes (metformin bid, soliqua 55u) 2. Joint replacements in the past 12 months: no 3. Major health problems in the past 3 months: no 4. Has an artificial valve or MVP: no 5. Has a defibrillator: no 6. Has been advised in past to take antibiotics in advance of a procedure like teeth cleaning: no 7. Family history of colon cancer: no  8. Alcohol Use: no 9. History of sleep apnea: no  10. History of coronary artery or other vascular stents placed within the last 12 months: no 11. History of any prior anesthesia complications: no    MEDICATIONS & ALLERGIES:    Patient reports the following regarding taking any blood thinners:   Plavix? no Aspirin? no Coumadin? no Brilinta? no Xarelto? no Eliquis? no Pradaxa? no Savaysa? no Effient? no  Patient confirms/reports the following medications:  Current Outpatient Medications  Medication Sig Dispense Refill  . atenolol (TENORMIN) 50 MG tablet Take 50 mg daily by mouth.    . Insulin Glargine-Lixisenatide (SOLIQUA) 100-33 UNT-MCG/ML SOPN Inject 55 Units into the skin daily.     Marland Kitchen levothyroxine (SYNTHROID, LEVOTHROID) 150 MCG tablet Take 150 mcg by mouth daily before breakfast.     . lisinopril (PRINIVIL,ZESTRIL) 20 MG tablet Take 20 mg by mouth daily.  3  . metFORMIN (GLUCOPHAGE) 500 MG tablet Take 500 mg by mouth 2 (two) times daily with a meal.    . Multiple Vitamins-Minerals (MULTIVITAMIN GUMMIES WOMENS) CHEW Chew 1 tablet daily by mouth.    . rosuvastatin (CRESTOR) 10 MG tablet Take 10 mg by mouth at bedtime.      No current facility-administered medications for this visit.     Patient confirms/reports the following allergies:  No Known Allergies  No orders of the defined types were placed in  this encounter.   AUTHORIZATION INFORMATION Primary Insurance: [employer] [place],  [place] #:[id number] Pre-Cert / Auth required: no   SCHEDULE INFORMATION: Procedure has been scheduled as follows:  Date: 07/07/18, Time:1:00 Location: [facility] Dr.Rourk  This Gastroenterology Pre-Precedure Review Form is being routed to the following provider(s): Neil Crouch, PA

## 2018-04-28 NOTE — Patient Instructions (Addendum)
Sandy Price   [birth year] MRN: [id number]    Procedure Date: 07/07/18 Time to register: 12:00pm Place to register: [facility] Procedure Time: 1:00pm Scheduled provider: R. Garfield Cornea, MD  PREPARATION FOR COLONOSCOPY WITH TRI-LYTE SPLIT PREP  Please notify us immediately if you are diabetic, take iron supplements, or if you are on Coumadin or any other blood thinners.   Please hold the following medications: I will mail you a letter with this information   You will need to purchase 1 fleet enema and 1 box of Bisacodyl 90m tablets. You can purchase these at your local pharmacy.    2 DAYS BEFORE PROCEDURE:  DATE: 07/05/18  DAY: Monday Begin clear liquid diet AFTER your lunch meal. NO SOLID FOODS after this point.  1 DAY BEFORE PROCEDURE:  DATE: 07/06/18  DAY: Tuesday Continue clear liquids the entire day - NO SOLID FOOD.   Diabetic medications adjustments for today: see letter  At 2:00 pm:  Take 2 Bisacodyl tablets.   At 4:00pm:  Start drinking your solution. Make sure you mix well per instructions on the bottle. Try to drink 1 (one) 8 ounce glass every 10-15 minutes until you have consumed HALF the jug. You should complete by 6:00pm.You must keep the left over solution refrigerated until completed next day.  Continue clear liquids. You must drink plenty of clear liquids to prevent dehyration and kidney failure.     DAY OF PROCEDURE:   DATE: 07/07/18   DAY: Wednesday If you take medications for your heart, blood pressure or breathing, you may take these medications.  Diabetic medications adjustments for today: see letter  Five hours before your procedure time @ 8:00am:  Finish remaining amout of bowel prep, drinking 1 (one) 8 ounce glass every 10-15 minutes until complete. You have two hours to consume remaining prep.   Three hours before your procedure time _0 :00am:  Nothing by mouth.   At least one hour before going to the hospital:  Give yourself one Fleet  enema. You may take your morning medications with sip of water unless we have instructed otherwise.      Please see below for Dietary Information.  CLEAR LIQUIDS INCLUDE:  Water Jello (NOT red in color)   Ice Popsicles (NOT red in color)   Tea (sugar ok, no milk/cream) Powdered fruit flavored drinks  Coffee (sugar ok, no milk/cream) Gatorade/ Lemonade/ Kool-Aid  (NOT red in color)   Juice: apple, white grape, white cranberry Soft drinks  Clear bullion, consomme, broth (fat free beef/chicken/vegetable)  Carbonated beverages (any kind)  Strained chicken noodle soup Hard Candy   Remember: Clear liquids are liquids that will allow you to see your fingers on the other side of a clear glass. Be sure liquids are NOT red in color, and not cloudy, but CLEAR.  DO NOT EAT OR DRINK ANY OF THE FOLLOWING:  Dairy products of any kind   Cranberry juice Tomato juice / V8 juice   Grapefruit juice Orange juice     Red grape juice  Do not eat any solid foods, including such foods as: cereal, oatmeal, yogurt, fruits, vegetables, creamed soups, eggs, bread, crackers, pureed foods in a blender, etc.   HELPFUL HINTS FOR DRINKING PREP SOLUTION:   Make sure prep is extremely cold. Mix and refrigerate the the morning of the prep. You may also put in the freezer.   You may try mixing some Crystal Light or Country Time Lemonade if you prefer. Mix in small amounts;  add more if necessary.  Try drinking through a straw  Rinse mouth with water or a mouthwash between glasses, to remove after-taste.  Try sipping on a cold beverage /ice/ popsicles between glasses of prep.  Place a piece of sugar-free hard candy in mouth between glasses.  If you become nauseated, try consuming smaller amounts, or stretch out the time between glasses. Stop for 30-60 minutes, then slowly start back drinking.        OTHER INSTRUCTIONS  You will need a responsible adult at least 53 years of age to accompany you and drive  you home. This person must remain in the waiting room during your procedure. The hospital will cancel your procedure if you do not have a responsible adult with you.   1. Wear loose fitting clothing that is easily removed. 2. Leave jewelry and other valuables at home.  3. Remove all body piercing jewelry and leave at home. 4. Total time from sign-in until discharge is approximately 2-3 hours. 5. You should go home directly after your procedure and rest. You can resume normal activities the day after your procedure. 6. The day of your procedure you should not:  Drive  Make legal decisions  Operate machinery  Drink alcohol  Return to work   You may call the office (Dept: [phone]) before 5:00pm, or page the doctor on call [phone]) after 5:00pm, for further instructions, if necessary.   Insurance Information YOU WILL NEED TO CHECK WITH YOUR INSURANCE COMPANY FOR THE BENEFITS OF COVERAGE YOU HAVE FOR THIS PROCEDURE.  UNFORTUNATELY, NOT ALL INSURANCE COMPANIES HAVE BENEFITS TO COVER ALL OR PART OF THESE TYPES OF PROCEDURES.  IT IS YOUR RESPONSIBILITY TO CHECK YOUR BENEFITS, HOWEVER, WE WILL BE GLAD TO ASSIST YOU WITH ANY CODES YOUR INSURANCE COMPANY MAY NEED.    PLEASE NOTE THAT MOST INSURANCE COMPANIES WILL NOT COVER A SCREENING COLONOSCOPY FOR PEOPLE UNDER THE AGE OF 50  IF YOU HAVE BC[employer] INSURANCE, YOU MAY HAVE BENEFITS FOR A SCREENING COLONOSCOPY BUT IF POLYPS ARE FOUND THE DIAGNOSIS WILL CHANGE AND THEN YOU MAY HAVE A DEDUCTIBLE THAT WILL NEED TO BE MET. SO PLEASE MAKE SURE YOU CHECK YOUR BENEFITS FOR A SCREENING COLONOSCOPY AS WELL AS A DIAGNOSTIC COLONOSCOPY.

## 2018-04-30 NOTE — Progress Notes (Signed)
Letter mailed to the pt with DM instructions 

## 2018-07-05 ENCOUNTER — Encounter (INDEPENDENT_AMBULATORY_CARE_PROVIDER_SITE_OTHER): Payer: BLUE CROSS/BLUE SHIELD | Admitting: Ophthalmology

## 2018-07-05 ENCOUNTER — Telehealth: Payer: Self-pay

## 2018-07-05 DIAGNOSIS — E103311 Type 1 diabetes mellitus with moderate nonproliferative diabetic retinopathy with macular edema, right eye: Secondary | ICD-10-CM | POA: Diagnosis not present

## 2018-07-05 DIAGNOSIS — I10 Essential (primary) hypertension: Secondary | ICD-10-CM | POA: Diagnosis not present

## 2018-07-05 DIAGNOSIS — D3131 Benign neoplasm of right choroid: Secondary | ICD-10-CM

## 2018-07-05 DIAGNOSIS — H43813 Vitreous degeneration, bilateral: Secondary | ICD-10-CM

## 2018-07-05 DIAGNOSIS — E10311 Type 1 diabetes mellitus with unspecified diabetic retinopathy with macular edema: Secondary | ICD-10-CM | POA: Diagnosis not present

## 2018-07-05 DIAGNOSIS — H35033 Hypertensive retinopathy, bilateral: Secondary | ICD-10-CM

## 2018-07-05 DIAGNOSIS — E103392 Type 1 diabetes mellitus with moderate nonproliferative diabetic retinopathy without macular edema, left eye: Secondary | ICD-10-CM

## 2018-07-05 NOTE — Telephone Encounter (Signed)
Pt called the office and left a voicemail, she had questions about her prep for her procedure on 07/07/18. She said she has not gotten her prep from the pharmacy yet. And she was wondering where to pick it up at and if she should be fasting at this point.   I tried to call the pt back, NA- LM on voicemail- advised her that she should have all of this on the instructions that I had given her and she needed to pick up rx at [facility] in [place], she should call them and ask them to get it ready for her.  Also told her that she should be on a clear liquid diet after lunch today and her DM medication instructions.  Asked her to call back if she had any further questions.

## 2018-07-05 NOTE — Telephone Encounter (Signed)
Spoke with the pt, the last thing she had to eat today was around 1:00. She has only had liquids since then. I advised to not have any solid foods the rest of today and tomorrow, went over clear liquids and to make sure nothing was red, pt stated she understood. She said she thinks her instructions are at home and she will look for them when she gets there. She is going to pick up prep tomorrow and will call me if she has any questions or if she cannot find her instructions.

## 2018-07-06 ENCOUNTER — Telehealth: Payer: Self-pay | Admitting: Internal Medicine

## 2018-07-06 NOTE — Telephone Encounter (Signed)
Pt is scheduled procedure with RMR tomorrow and has a question about her medications and her prep. Please call her at [phone]

## 2018-07-06 NOTE — Telephone Encounter (Signed)
Tried to call pt again, NA- LM with all DM medication instructions and that she could take any medications for her heart, BP or breathing in the AM. Advised her that at this time the office is closed but we do have someone on call at the hospital if she still has questions.

## 2018-07-06 NOTE — Telephone Encounter (Signed)
Tried to call pt- NA- LMOM 

## 2018-07-07 ENCOUNTER — Encounter (HOSPITAL_COMMUNITY): Payer: Self-pay | Admitting: *Deleted

## 2018-07-07 ENCOUNTER — Other Ambulatory Visit: Payer: Self-pay

## 2018-07-07 ENCOUNTER — Ambulatory Visit (HOSPITAL_COMMUNITY)
Admission: RE | Admit: 2018-07-07 | Discharge: 2018-07-07 | Disposition: A | Discharge status: Home / Self Care | Payer: BLUE CROSS/BLUE SHIELD | Attending: Internal Medicine | Admitting: Internal Medicine

## 2018-07-07 ENCOUNTER — Encounter (HOSPITAL_COMMUNITY)
Admission: RE | Disposition: A | Discharge status: Home / Self Care | Payer: Self-pay | Source: Home / Self Care | Attending: Internal Medicine

## 2018-07-07 DIAGNOSIS — Z794 Long term (current) use of insulin: Secondary | ICD-10-CM | POA: Insufficient documentation

## 2018-07-07 DIAGNOSIS — E119 Type 2 diabetes mellitus without complications: Secondary | ICD-10-CM | POA: Diagnosis not present

## 2018-07-07 DIAGNOSIS — Z79899 Other long term (current) drug therapy: Secondary | ICD-10-CM | POA: Diagnosis not present

## 2018-07-07 DIAGNOSIS — Q969 Turner's syndrome, unspecified: Secondary | ICD-10-CM | POA: Insufficient documentation

## 2018-07-07 DIAGNOSIS — K5730 Diverticulosis of large intestine without perforation or abscess without bleeding: Secondary | ICD-10-CM | POA: Insufficient documentation

## 2018-07-07 DIAGNOSIS — I10 Essential (primary) hypertension: Secondary | ICD-10-CM | POA: Insufficient documentation

## 2018-07-07 DIAGNOSIS — E039 Hypothyroidism, unspecified: Secondary | ICD-10-CM | POA: Insufficient documentation

## 2018-07-07 DIAGNOSIS — Z1211 Encounter for screening for malignant neoplasm of colon: Secondary | ICD-10-CM | POA: Insufficient documentation

## 2018-07-07 DIAGNOSIS — M1990 Unspecified osteoarthritis, unspecified site: Secondary | ICD-10-CM | POA: Diagnosis not present

## 2018-07-07 DIAGNOSIS — E7800 Pure hypercholesterolemia, unspecified: Secondary | ICD-10-CM | POA: Insufficient documentation

## 2018-07-07 HISTORY — PX: COLONOSCOPY: SHX5424

## 2018-07-07 LAB — GLUCOSE, CAPILLARY
Glucose-Capillary: 65 mg/dL — ABNORMAL LOW (ref 70–99)
Glucose-Capillary: 98 mg/dL (ref 70–99)

## 2018-07-07 SURGERY — COLONOSCOPY
Anesthesia: Moderate Sedation

## 2018-07-07 MED ORDER — STERILE WATER FOR IRRIGATION IR SOLN
Status: DC | PRN
  Administered 2018-07-07: 1.5 mL

## 2018-07-07 MED ORDER — MIDAZOLAM HCL 5 MG/5ML IJ SOLN
INTRAMUSCULAR | Status: AC
  Filled 2018-07-07: qty 10

## 2018-07-07 MED ORDER — DEXTROSE 50 % IV SOLN: 25.0000 mL | Freq: Once | INTRAVENOUS | Status: DC

## 2018-07-07 MED ORDER — DEXTROSE 50 % IV SOLN
INTRAVENOUS | Status: AC
  Administered 2018-07-07: 50 mL
  Filled 2018-07-07: qty 50

## 2018-07-07 MED ORDER — MEPERIDINE HCL 50 MG/ML IJ SOLN
INTRAMUSCULAR | Status: AC
  Filled 2018-07-07: qty 1

## 2018-07-07 MED ORDER — ONDANSETRON HCL 4 MG/2ML IJ SOLN
INTRAMUSCULAR | Status: AC
  Filled 2018-07-07: qty 2

## 2018-07-07 MED ORDER — MIDAZOLAM HCL 5 MG/5ML IJ SOLN
INTRAMUSCULAR | Status: DC | PRN
  Administered 2018-07-07 (×2): 1 mg via INTRAVENOUS

## 2018-07-07 MED ORDER — SODIUM CHLORIDE 0.9 % IV SOLN
INTRAVENOUS | Status: DC
  Administered 2018-07-07: 11:00:00 via INTRAVENOUS

## 2018-07-07 MED ORDER — ONDANSETRON HCL 4 MG/2ML IJ SOLN
INTRAMUSCULAR | Status: DC | PRN
  Administered 2018-07-07: 4 mg via INTRAVENOUS

## 2018-07-07 MED ORDER — MEPERIDINE HCL 100 MG/ML IJ SOLN
INTRAMUSCULAR | Status: DC | PRN
  Administered 2018-07-07: 25 mg via INTRAVENOUS

## 2018-07-07 NOTE — Discharge Instructions (Signed)
Colonoscopy Discharge Instructions  Read the instructions outlined below and refer to this sheet in the next few weeks. These discharge instructions provide you with general information on caring for yourself after you leave the hospital. Your doctor may also give you specific instructions. While your treatment has been planned according to the most current medical practices available, unavoidable complications occasionally occur. If you have any problems or questions after discharge, call Dr. Gala Romney at [phone]. ACTIVITY  You may resume your regular activity, but move at a slower pace for the next 24 hours.   Take frequent rest periods for the next 24 hours.   Walking will help get rid of the air and reduce the bloated feeling in your belly (abdomen).   No driving for 24 hours (because of the medicine (anesthesia) used during the test).    Do not sign any important legal documents or operate any machinery for 24 hours (because of the anesthesia used during the test).  NUTRITION  Drink plenty of fluids.   You may resume your normal diet as instructed by your doctor.   Begin with a light meal and progress to your normal diet. Heavy or fried foods are harder to digest and may make you feel sick to your stomach (nauseated).   Avoid alcoholic beverages for 24 hours or as instructed.  MEDICATIONS  You may resume your normal medications unless your doctor tells you otherwise.  WHAT YOU CAN EXPECT TODAY  Some feelings of bloating in the abdomen.   Passage of more gas than usual.   Spotting of blood in your stool or on the toilet paper.  IF YOU HAD POLYPS REMOVED DURING THE COLONOSCOPY:  No aspirin products for 7 days or as instructed.   No alcohol for 7 days or as instructed.   Eat a soft diet for the next 24 hours.  FINDING OUT THE RESULTS OF YOUR TEST Not all test results are available during your visit. If your test results are not back during the visit, make an appointment  with your caregiver to find out the results. Do not assume everything is normal if you have not heard from your caregiver or the medical facility. It is important for you to follow up on all of your test results.  SEEK IMMEDIATE MEDICAL ATTENTION IF:  You have more than a spotting of blood in your stool.   Your belly is swollen (abdominal distention).   You are nauseated or vomiting.   You have a temperature over 101.   You have abdominal pain or discomfort that is severe or gets worse throughout the day.   Diverticulosis information provided  Repeat colonoscopy in 10 years for screening purposes     Diverticulosis  Diverticulosis is a condition that develops when small pouches (diverticula) form in the wall of the large intestine (colon). The colon is where water is absorbed and stool is formed. The pouches form when the inside layer of the colon pushes through weak spots in the outer layers of the colon. You may have a few pouches or many of them. What are the causes? The cause of this condition is not known. What increases the risk? The following factors may make you more likely to develop this condition:  Being older than age 30. Your risk for this condition increases with age. Diverticulosis is rare among people younger than age 50. By age 25, many people have it.  Eating a low-fiber diet.  Having frequent constipation.  Being overweight.  Not  getting enough exercise.  Smoking.  Taking over-the-counter pain medicines, like aspirin and ibuprofen.  Having a family history of diverticulosis. What are the signs or symptoms? In most people, there are no symptoms of this condition. If you do have symptoms, they may include:  Bloating.  Cramps in the abdomen.  Constipation or diarrhea.  Pain in the lower left side of the abdomen. How is this diagnosed? This condition is most often diagnosed during an exam for other colon problems. Because diverticulosis usually has  no symptoms, it often cannot be diagnosed independently. This condition may be diagnosed by:  Using a flexible scope to examine the colon (colonoscopy).  Taking an X-ray of the colon after dye has been put into the colon (barium enema).  Doing a CT scan. How is this treated? You may not need treatment for this condition if you have never developed an infection related to diverticulosis. If you have had an infection before, treatment may include:  Eating a high-fiber diet. This may include eating more fruits, vegetables, and grains.  Taking a fiber supplement.  Taking a live bacteria supplement (probiotic).  Taking medicine to relax your colon.  Taking antibiotic medicines. Follow these instructions at home:  Drink 6-8 glasses of water or more each day to prevent constipation.  Try not to strain when you have a bowel movement.  If you have had an infection before: ? Eat more fiber as directed by your health care provider or your diet and nutrition specialist (dietitian). ? Take a fiber supplement or probiotic, if your health care provider approves.  Take over-the-counter and prescription medicines only as told by your health care provider.  If you were prescribed an antibiotic, take it as told by your health care provider. Do not stop taking the antibiotic even if you start to feel better.  Keep all follow-up visits as told by your health care provider. This is important. Contact a health care provider if:  You have pain in your abdomen.  You have bloating.  You have cramps.  You have not had a bowel movement in 3 days. Get help right away if:  Your pain gets worse.  Your bloating becomes very bad.  You have a fever or chills, and your symptoms suddenly get worse.  You vomit.  You have bowel movements that are bloody or black.  You have bleeding from your rectum. Summary  Diverticulosis is a condition that develops when small pouches (diverticula) form in the  wall of the large intestine (colon).  You may have a few pouches or many of them.  This condition is most often diagnosed during an exam for other colon problems.  If you have had an infection related to diverticulosis, treatment may include increasing the fiber in your diet, taking supplements, or taking medicines. This information is not intended to replace advice given to you by your health care provider. Make sure you discuss any questions you have with your health care provider. Document Released: 03/06/2004 Document Revised: 04/28/2016 Document Reviewed: 04/28/2016 Elsevier Interactive Patient Education  2019 [employer].

## 2018-07-07 NOTE — Op Note (Signed)
[facility] Patient Name: Sandy Price Procedure Date: 07/07/2018 10:56 AM MRN: [id number] Date of Birth: [birth year] Attending MD: Norvel Richards , MD CSN: [id number] Age: 53 Admit Type: Ambulatory Procedure:                Colonoscopy Indications:              Screening for colorectal malignant neoplasm Providers:                Norvel Richards, MD, Lurline Del, RN, Charlsie Quest.                            Theda Sers RN, RN Referring MD:              Medicines:                Midazolam 2 mg IV, Meperidine 25 mg IV, Ondansetron                            4 mg IV Complications:            No immediate complications. Estimated Blood Loss:     Estimated blood loss: none. Procedure:                Pre-Anesthesia Assessment:                           - Prior to the procedure, a History and Physical                            was performed, and patient medications and                            allergies were reviewed. The patient's tolerance of                            previous anesthesia was also reviewed. The risks                            and benefits of the procedure and the sedation                            options and risks were discussed with the patient.                            All questions were answered, and informed consent                            was obtained. Prior Anticoagulants: The patient has                            taken no previous anticoagulant or antiplatelet                            agents. After reviewing the risks and benefits, the  patient was deemed in satisfactory condition to                            undergo the procedure.                           After obtaining informed consent, the colonoscope                            was passed under direct vision. Throughout the                            procedure, the patient's blood pressure, pulse, and                            oxygen saturations were monitored  continuously. The                            [id number]-[id number] ([id number]) scope was introduced through                            the anus and advanced to the the cecum, identified                            by appendiceal orifice and ileocecal valve. The                            colonoscopy was performed without difficulty. The                            patient tolerated the procedure well. The quality                            of the bowel preparation was adequate. The                            ileocecal valve, appendiceal orifice, and rectum                            were photographed. The entire colon was well                            visualized. Scope In: 11:27:09 AM Scope Out: 11:38:59 AM Scope Withdrawal Time: 0 hours 6 minutes 56 seconds  Total Procedure Duration: 0 hours 11 minutes 50 seconds  Findings:      The perianal and digital rectal examinations were normal.      Multiple small and large-mouthed diverticula were found in the entire       colon.      The exam was otherwise without abnormality on direct and retroflexion       views. Impression:               - Diverticulosis in the entire examined colon.                           -  The examination was otherwise normal on direct                            and retroflexion views.                           - No specimens collected. Moderate Sedation:      Moderate (conscious) sedation was administered by the endoscopy nurse       and supervised by the endoscopist. The following parameters were       monitored: oxygen saturation, heart rate, blood pressure, respiratory       rate, EKG, adequacy of pulmonary ventilation, and response to care.       Total physician intraservice time was 16 minutes. Recommendation:           - Patient has a contact number available for                            emergencies. The signs and symptoms of potential                            delayed complications were discussed with the                             patient. Return to normal activities tomorrow.                            Written discharge instructions were provided to the                            patient.                           - Advance diet as tolerated.                           - Continue present medications.                           - Repeat colonoscopy in 10 years for screening                            purposes.                           - Return to GI office PRN. Procedure Code(s):        --- Professional ---                           [phone], Colonoscopy, flexible; diagnostic, including                            collection of specimen(s) by brushing or washing,                            when performed (separate procedure)  G0500, Moderate sedation services provided by the                            same physician or other qualified health care                            professional performing a gastrointestinal                            endoscopic service that sedation supports,                            requiring the presence of an independent trained                            observer to assist in the monitoring of the                            patient's level of consciousness and physiological                            status; initial 15 minutes of intra-service time;                            patient age 82 years or older (additional time may                            be reported with [phone], as appropriate) Diagnosis Code(s):        --- Professional ---                           Z12.11, Encounter for screening for malignant                            neoplasm of colon                           K57.30, Diverticulosis of large intestine without                            perforation or abscess without bleeding CPT copyright 2018 American [employer] Association. All rights reserved. The codes documented in this report are preliminary and upon coder review may  be revised  to meet current compliance requirements. Cristopher Estimable. Babette Stum, MD Norvel Richards, MD 07/07/2018 11:45:37 AM This report has been signed electronically. Number of Addenda: 0

## 2018-07-07 NOTE — H&P (Signed)
@LOGO @   Primary Care Physician:  Celene Squibb, MD Primary Gastroenterologist:  Dr. Gala Romney  Pre-Procedure History & Physical: HPI:  Sandy Price is a 53 y.o. female is here for a screening colonoscopy. No bowel symptoms.  No prior colonoscopy.  No family history of colon cancer.  Past Medical History:  Diagnosis Date  . Diabetes mellitus   . DJD (degenerative joint disease)   . DJD (degenerative joint disease)   . Hypercholesteremia   . Hypertension   . Hypothyroidism   . Thyroid disease   . Turner syndrome     Past Surgical History:  Procedure Laterality Date  . CATARACT EXTRACTION W/PHACO Right 05/08/2017   Procedure: CATARACT EXTRACTION PHACO AND INTRAOCULAR LENS PLACEMENT (IOC);  Surgeon: Baruch Goldmann, MD;  Location: [facility];  Service: Ophthalmology;  Laterality: Right;  CDE: 3.69  . CATARACT EXTRACTION W/PHACO Left 07/10/2017   Procedure: CATARACT EXTRACTION PHACO AND INTRAOCULAR LENS PLACEMENT (IOC);  Surgeon: Baruch Goldmann, MD;  Location: [facility];  Service: Ophthalmology;  Laterality: Left;  CDE: 2.10    Prior to Admission medications   Medication Sig Start Date End Date Taking? Authorizing Provider  atenolol (TENORMIN) 50 MG tablet Take 50 mg daily by mouth.   Yes [provider]  Besifloxacin HCl (BESIVANCE) 0.6 % SUSP Apply 1 drop to eye See admin instructions. Uses once a month after eye injection.    Yes [provider]  ibuprofen (ADVIL,MOTRIN) 200 MG tablet Take 400 mg by mouth every 6 (six) hours as needed for headache, mild pain or moderate pain.   Yes [provider]  Insulin Glargine-Lixisenatide (SOLIQUA) 100-33 UNT-MCG/ML SOPN Inject 55 Units into the skin daily.    Yes [provider]  levothyroxine (SYNTHROID, LEVOTHROID) 175 MCG tablet Take 175 mcg by mouth daily before breakfast.   Yes [provider]  lisinopril (PRINIVIL,ZESTRIL) 20 MG tablet Take 20 mg by mouth daily. 04/07/18  Yes [provider]   metFORMIN (GLUCOPHAGE) 500 MG tablet Take 1,000 mg by mouth every evening.    Yes [provider]  Multiple Vitamins-Minerals (CENTRUM SILVER 50+WOMEN PO) Take 1 tablet by mouth daily.   Yes [provider]  polyethylene glycol-electrolytes (TRILYTE) 420 g solution Take 4,000 mLs by mouth as directed. 04/28/18  Yes Mahala Menghini, PA-C  rosuvastatin (CRESTOR) 10 MG tablet Take 10 mg by mouth at bedtime.    Yes [provider]    Allergies as of 04/28/2018  . (No Known Allergies)    History reviewed. No pertinent family history.  Social History   Socioeconomic History  . Marital status: Single    Spouse name: Not on file  . Number of children: Not on file  . Years of education: Not on file  . Highest education level: Not on file  Occupational History  . Not on file  Social Needs  . Financial resource strain: Not on file  . Food insecurity:    Worry: Not on file    Inability: Not on file  . Transportation needs:    Medical: Not on file    Non-medical: Not on file  Tobacco Use  . Smoking status: Never Smoker  . Smokeless tobacco: Never Used  Substance and Sexual Activity  . Alcohol use: No  . Drug use: No  . Sexual activity: Never    Birth control/protection: Post-menopausal  Lifestyle  . Physical activity:    Days per week: Not on file    Minutes per session: Not  on file  . Stress: Not on file  Relationships  . Social connections:    Talks on phone: Not on file    Gets together: Not on file    Attends religious service: Not on file    Active member of club or organization: Not on file    Attends meetings of clubs or organizations: Not on file    Relationship status: Not on file  . Intimate partner violence:    Fear of current or ex partner: Not on file    Emotionally abused: Not on file    Physically abused: Not on file    Forced sexual activity: Not on file  Other Topics Concern  . Not on file  Social History Narrative  . Not on  file    Review of Systems: See HPI, otherwise negative ROS  Physical Exam: BP (!) 171/99   Pulse (!) 55   Temp 97.9 F (36.6 C) (Oral)   Resp 20   Ht 4\' 11"  (1.499 m)   Wt 61.2 kg   SpO2 100%   BMI 27.27 kg/m  General:   Alert,  Well-developed, well-nourished, pleasant and cooperative in NAD Lungs:  Clear throughout to auscultation.   No wheezes, crackles, or rhonchi. No acute distress. Heart:  Regular rate and rhythm; no murmurs, clicks, rubs,  or gallops. Abdomen:  Soft, nontender and nondistended. No masses, hepatosplenomegaly or hernias noted. Normal bowel sounds, without guarding, and without rebound.    Impression/Plan: Sandy Price is now here to undergo a screening colonoscopy.    First ever average rescreening examination.  Risks, benefits, limitations, imponderables and alternatives regarding colonoscopy have been reviewed with the patient. Questions have been answered. All parties agreeable.     Notice:  This dictation was prepared with Dragon dictation along with smaller phrase technology. Any transcriptional errors that result from this process are unintentional and may not be corrected upon review.

## 2018-07-07 NOTE — Telephone Encounter (Signed)
Pt had tcs today.

## 2018-07-09 ENCOUNTER — Encounter (HOSPITAL_COMMUNITY): Payer: Self-pay | Admitting: Internal Medicine

## 2018-09-13 ENCOUNTER — Encounter (INDEPENDENT_AMBULATORY_CARE_PROVIDER_SITE_OTHER): Payer: BLUE CROSS/BLUE SHIELD | Admitting: Ophthalmology

## 2018-09-13 ENCOUNTER — Other Ambulatory Visit: Payer: Self-pay

## 2018-09-13 DIAGNOSIS — E113311 Type 2 diabetes mellitus with moderate nonproliferative diabetic retinopathy with macular edema, right eye: Secondary | ICD-10-CM | POA: Diagnosis not present

## 2018-09-13 DIAGNOSIS — I10 Essential (primary) hypertension: Secondary | ICD-10-CM | POA: Diagnosis not present

## 2018-09-13 DIAGNOSIS — E113392 Type 2 diabetes mellitus with moderate nonproliferative diabetic retinopathy without macular edema, left eye: Secondary | ICD-10-CM

## 2018-09-13 DIAGNOSIS — D3131 Benign neoplasm of right choroid: Secondary | ICD-10-CM

## 2018-09-13 DIAGNOSIS — E11311 Type 2 diabetes mellitus with unspecified diabetic retinopathy with macular edema: Secondary | ICD-10-CM | POA: Diagnosis not present

## 2018-09-13 DIAGNOSIS — H35033 Hypertensive retinopathy, bilateral: Secondary | ICD-10-CM

## 2018-09-13 DIAGNOSIS — H43813 Vitreous degeneration, bilateral: Secondary | ICD-10-CM

## 2018-12-06 ENCOUNTER — Encounter (INDEPENDENT_AMBULATORY_CARE_PROVIDER_SITE_OTHER): Payer: BC Managed Care – PPO | Admitting: Ophthalmology

## 2018-12-06 ENCOUNTER — Other Ambulatory Visit: Payer: Self-pay

## 2018-12-06 DIAGNOSIS — E11311 Type 2 diabetes mellitus with unspecified diabetic retinopathy with macular edema: Secondary | ICD-10-CM

## 2018-12-06 DIAGNOSIS — I10 Essential (primary) hypertension: Secondary | ICD-10-CM | POA: Diagnosis not present

## 2018-12-06 DIAGNOSIS — D3131 Benign neoplasm of right choroid: Secondary | ICD-10-CM

## 2018-12-06 DIAGNOSIS — E113311 Type 2 diabetes mellitus with moderate nonproliferative diabetic retinopathy with macular edema, right eye: Secondary | ICD-10-CM | POA: Diagnosis not present

## 2018-12-06 DIAGNOSIS — E113392 Type 2 diabetes mellitus with moderate nonproliferative diabetic retinopathy without macular edema, left eye: Secondary | ICD-10-CM | POA: Diagnosis not present

## 2018-12-06 DIAGNOSIS — H43813 Vitreous degeneration, bilateral: Secondary | ICD-10-CM

## 2018-12-06 DIAGNOSIS — H35033 Hypertensive retinopathy, bilateral: Secondary | ICD-10-CM

## 2019-02-14 ENCOUNTER — Other Ambulatory Visit: Payer: Self-pay

## 2019-02-14 ENCOUNTER — Encounter (INDEPENDENT_AMBULATORY_CARE_PROVIDER_SITE_OTHER): Payer: BC Managed Care – PPO | Admitting: Ophthalmology

## 2019-02-14 DIAGNOSIS — H35033 Hypertensive retinopathy, bilateral: Secondary | ICD-10-CM

## 2019-02-14 DIAGNOSIS — H43813 Vitreous degeneration, bilateral: Secondary | ICD-10-CM

## 2019-02-14 DIAGNOSIS — E113213 Type 2 diabetes mellitus with mild nonproliferative diabetic retinopathy with macular edema, bilateral: Secondary | ICD-10-CM | POA: Diagnosis not present

## 2019-02-14 DIAGNOSIS — E11311 Type 2 diabetes mellitus with unspecified diabetic retinopathy with macular edema: Secondary | ICD-10-CM | POA: Diagnosis not present

## 2019-02-14 DIAGNOSIS — I10 Essential (primary) hypertension: Secondary | ICD-10-CM

## 2019-04-25 ENCOUNTER — Encounter (INDEPENDENT_AMBULATORY_CARE_PROVIDER_SITE_OTHER): Payer: BC Managed Care – PPO | Admitting: Ophthalmology

## 2019-08-21 ENCOUNTER — Other Ambulatory Visit: Payer: Self-pay

## 2019-08-21 ENCOUNTER — Ambulatory Visit: Payer: Self-pay | Attending: Internal Medicine

## 2019-08-21 DIAGNOSIS — Z23 Encounter for immunization: Secondary | ICD-10-CM | POA: Insufficient documentation

## 2019-08-21 NOTE — Progress Notes (Signed)
   Covid-19 Vaccination Clinic  Name:  Sandy Price    MRN: [id number] DOB: [birth year]  08/21/2019  [name] was observed post Covid-19 immunization for 15 minutes without incidence. She was provided with Vaccine Information Sheet and instruction to access the V-Safe system.   [name] was instructed to call 911 with any severe reactions post vaccine: Marland Kitchen Difficulty breathing  . Swelling of your face and throat  . A fast heartbeat  . A bad rash all over your body  . Dizziness and weakness    Immunizations Administered    Name Date Dose VIS Date Route   Moderna COVID-19 Vaccine 08/21/2019 12:44 PM 0.5 mL 05/24/2019 Intramuscular   Manufacturer: Moderna   Lot: [id number]   [id number]

## 2019-09-24 ENCOUNTER — Ambulatory Visit: Payer: Self-pay | Attending: Internal Medicine

## 2019-09-24 DIAGNOSIS — Z23 Encounter for immunization: Secondary | ICD-10-CM

## 2019-09-24 NOTE — Progress Notes (Signed)
   Covid-19 Vaccination Clinic  Name:  Sandy Price    MRN: [id number] DOB: Jun 28, 1965  09/24/2019  Sandy Price was observed post Covid-19 immunization for 15 minutes without incident. She was provided with Vaccine Information Sheet and instruction to access the V-Safe system.   Sandy Price was instructed to call 911 with any severe reactions post vaccine: Marland Kitchen Difficulty breathing  . Swelling of face and throat  . A fast heartbeat  . A bad rash all over body  . Dizziness and weakness   Immunizations Administered    Name Date Dose VIS Date Route   Moderna COVID-19 Vaccine 09/24/2019  1:44 PM 0.5 mL 05/24/2019 Intramuscular   Manufacturer: Moderna   Lot: [id number]   [id number]

## 2020-04-30 ENCOUNTER — Emergency Department (HOSPITAL_COMMUNITY)
Admission: EM | Admit: 2020-04-30 | Discharge: 2020-04-30 | Disposition: A | Discharge status: Home / Self Care | Payer: Self-pay | Attending: Emergency Medicine | Admitting: Emergency Medicine

## 2020-04-30 ENCOUNTER — Encounter (HOSPITAL_COMMUNITY): Payer: Self-pay

## 2020-04-30 ENCOUNTER — Other Ambulatory Visit: Payer: Self-pay

## 2020-04-30 DIAGNOSIS — E119 Type 2 diabetes mellitus without complications: Secondary | ICD-10-CM | POA: Insufficient documentation

## 2020-04-30 DIAGNOSIS — Z794 Long term (current) use of insulin: Secondary | ICD-10-CM | POA: Insufficient documentation

## 2020-04-30 DIAGNOSIS — Z7984 Long term (current) use of oral hypoglycemic drugs: Secondary | ICD-10-CM | POA: Insufficient documentation

## 2020-04-30 DIAGNOSIS — E039 Hypothyroidism, unspecified: Secondary | ICD-10-CM | POA: Insufficient documentation

## 2020-04-30 DIAGNOSIS — I10 Essential (primary) hypertension: Secondary | ICD-10-CM | POA: Insufficient documentation

## 2020-04-30 DIAGNOSIS — Z79899 Other long term (current) drug therapy: Secondary | ICD-10-CM | POA: Insufficient documentation

## 2020-04-30 DIAGNOSIS — H5789 Other specified disorders of eye and adnexa: Secondary | ICD-10-CM | POA: Insufficient documentation

## 2020-04-30 MED ORDER — FLUORESCEIN SODIUM 1 MG OP STRP
1.0000 | ORAL_STRIP | Freq: Once | OPHTHALMIC | Status: AC
  Administered 2020-04-30: 1 via OPHTHALMIC
  Filled 2020-04-30: qty 1

## 2020-04-30 MED ORDER — LORATADINE 10 MG PO TABS: 10.0000 mg | ORAL_TABLET | Freq: Every day | ORAL | 0 refills | Status: AC

## 2020-04-30 MED ORDER — TETRACAINE HCL 0.5 % OP SOLN
1.0000 [drp] | Freq: Once | OPHTHALMIC | Status: AC
  Administered 2020-04-30: 1 [drp] via OPHTHALMIC
  Filled 2020-04-30: qty 4

## 2020-04-30 NOTE — Discharge Instructions (Signed)
Seen here left eye irritation.  exam looks reassuring.  started you on Claritin please take as prescribed.  You may also continue with your eyedrops as this can help flush out anything in it.  I have Given you the contact information for an ophthalmologist please contact them for further evaluation management.  It is important that you call them tomorrow.  Come back to the emergency department if you develop chest pain, shortness of breath, severe abdominal pain, uncontrolled nausea, vomiting, diarrhea.

## 2020-04-30 NOTE — ED Triage Notes (Signed)
Pt reports left eye redness and irritation since waking up this morning.

## 2020-04-30 NOTE — ED Provider Notes (Signed)
[facility] EMERGENCY DEPARTMENT Provider Note   CSN: [id number] Arrival date & time: 04/30/20  1702     History Chief Complaint  Patient presents with  . Eye Pain    Sandy Price Agent is a 54 y.o. female.  HPI   Patient with significant medical history of diabetes, presents to the emergency department with chief complaint of left eye irritation.  Patient states last night she felt like there maybe something in her left eye.  She endorses that she continues to feel something in her left eye.  She endorses that it hurts when she moves her eyes or blinks.  She states it feels like a gritty sensation in the upper nasal aspect of her left eye.  She states she has been applying some eyedrops in her eye states it makes it feels a little better. she denies crust in her eye or difficulty opening up.  She denies the use of contacts or corrective lenses, denies change in vision, headaches, nasal congestion sore throat, cough.  She states is never happened to her before.  Patient denies headaches, fevers, chills, shortness of breath, chest pain, abdominal pain, nausea, vomiting, diarrhea, pedal edema.  Past Medical History:  Diagnosis Date  . Diabetes mellitus   . DJD (degenerative joint disease)   . DJD (degenerative joint disease)   . Hypercholesteremia   . Hypertension   . Hypothyroidism   . Thyroid disease   . Turner syndrome     Patient Active Problem List   Diagnosis Date Noted  . 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 CATARACT EXTRACTION W/PHACO Right 05/08/2017   Procedure: CATARACT EXTRACTION PHACO AND INTRAOCULAR LENS PLACEMENT (IOC);  Surgeon: Baruch Goldmann, MD;  Location: [facility];  Service: Ophthalmology;  Laterality: Right;  CDE: 3.69  . CATARACT EXTRACTION W/PHACO Left 07/10/2017   Procedure: CATARACT EXTRACTION PHACO AND INTRAOCULAR LENS PLACEMENT (IOC);  Surgeon: Baruch Goldmann, MD;  Location: [facility];   Service: Ophthalmology;  Laterality: Left;  CDE: 2.10  . COLONOSCOPY N/A 07/07/2018   Procedure: COLONOSCOPY;  Surgeon: Daneil Dolin, MD;  Location: [facility];  Service: Endoscopy;  Laterality: N/A;  1:00     OB History   No obstetric history on file.     No family history on file.  Social History   Tobacco Use  . Smoking status: Never Smoker  . Smokeless tobacco: Never Used  Vaping Use  . Vaping Use: Never used  Substance Use Topics  . Alcohol use: No  . Drug use: No    Home Medications Prior to Admission medications   Medication Sig Start Date End Date Taking? Authorizing Provider  atenolol (TENORMIN) 50 MG tablet Take 50 mg daily by mouth.   Yes [provider]  ibuprofen (ADVIL,MOTRIN) 200 MG tablet Take 400 mg by mouth every 6 (six) hours as needed for headache, mild pain or moderate pain.   Yes [provider]  Insulin Glargine-Lixisenatide (SOLIQUA) 100-33 UNT-MCG/ML SOPN Inject 65 Units into the skin daily.    Yes [provider]  levothyroxine (SYNTHROID, LEVOTHROID) 175 MCG tablet Take 175 mcg by mouth daily before breakfast.   Yes [provider]  lisinopril (PRINIVIL,ZESTRIL) 20 MG tablet Take 20 mg by mouth daily. 04/07/18  Yes [provider]  metFORMIN (GLUCOPHAGE) 500 MG tablet Take 1,000 mg by mouth every evening.    Yes [provider]  rosuvastatin (CRESTOR) 10 MG tablet Take  10 mg by mouth at bedtime.    Yes [provider]  Besifloxacin HCl (BESIVANCE) 0.6 % SUSP Apply 1 drop to eye See admin instructions. Uses once a month after eye injection.     [provider]  loratadine (CLARITIN) 10 MG tablet Take 1 tablet (10 mg total) by mouth daily for 10 days. 04/30/20 05/10/20  Marcello Fennel, PA-C  Multiple Vitamins-Minerals (CENTRUM SILVER 50+WOMEN PO) Take 1 tablet by mouth daily.    [provider]  polyethylene glycol-electrolytes (TRILYTE) 420 g solution Take 4,000 mLs  by mouth as directed. Patient not taking: Reported on 04/30/2020 04/28/18   Mahala Menghini, PA-C    Allergies    Patient has no known allergies.  Review of Systems   Review of Systems  Constitutional: Negative for chills and fever.  HENT: Negative for congestion, dental problem, ear discharge, ear pain, tinnitus, trouble swallowing and voice change.   Eyes: Positive for pain and redness. Negative for photophobia and visual disturbance.  Respiratory: Negative for shortness of breath.   Cardiovascular: Negative for chest pain.  Gastrointestinal: Negative for abdominal pain, diarrhea, nausea and vomiting.  Genitourinary: Negative for enuresis.  Musculoskeletal: Negative for back pain.  Skin: Negative for rash.  Neurological: Negative for dizziness.  Hematological: Does not bruise/bleed easily.    Physical Exam Updated Vital Signs BP (!) 125/92 (BP Location: Right Arm)   Pulse 91   Temp 99.1 F (37.3 C) (Oral)   Resp 20   Ht 4\' 11"  (1.499 m)   Wt 63.5 kg   SpO2 99%   BMI 28.28 kg/m   Physical Exam Vitals and nursing note reviewed.  Constitutional:      General: She is not in acute distress.    Appearance: Normal appearance. She is not ill-appearing or diaphoretic.  HENT:     Head: Normocephalic and atraumatic.     Nose: No congestion or rhinorrhea.  Eyes:     General: Scleral icterus present.        Right eye: No discharge.        Left eye: No discharge.     Extraocular Movements: Extraocular movements intact.     Conjunctiva/sclera: Conjunctivae normal.     Pupils: Pupils are equal, round, and reactive to light.     Comments: Patient's left eye was visualized and had slight scleral injection, eyes were PERRLA, EOMs were intact, visual acuity was intact.  While using a light no foreign bodies were noted, eyelid eversion was performed no gross abnormalities noted.  Pulmonary:     Effort: Pulmonary effort is normal. No respiratory distress.     Breath sounds: Normal  breath sounds. No wheezing.  Musculoskeletal:     Cervical back: Neck supple.     Right lower leg: No edema.     Left lower leg: No edema.  Skin:    General: Skin is warm and dry.     Coloration: Skin is not jaundiced or pale.  Neurological:     Mental Status: She is alert and oriented to person, place, and time.  Psychiatric:        Mood and Affect: Mood normal.     ED Results / Procedures / Treatments   Labs (all labs ordered are listed, but only abnormal results are displayed) Labs Reviewed - No data to display  EKG None  Radiology No results found.  Procedures .Foreign Body Removal  Date/Time: 04/30/2020 8:35 PM Performed by: Marcello Fennel, PA-C Authorized by: Ileene Patrick,  Orson Ape, PA-C  Body area: eye Location details: left conjunctiva Anesthesia: local infiltration  Anesthesia: Local Anesthetic: tetracaine drops Anesthetic total: 1 mL  Sedation: Patient sedated: no  Patient restrained: no Patient cooperative: yes Localization method: eyelid eversion, magnification and slit lamp Eye examined with fluorescein. No fluorescein uptake. No residual rust ring present. 0 objects recovered. Objects recovered: 0 Patient tolerance: patient tolerated the procedure well with no immediate complications Comments: Eyelid eversion, magnification, slit lamp, fluorescein staining performed no foreign body noted.   (including critical care time)  Medications Ordered in ED Medications  fluorescein ophthalmic strip 1 strip (1 strip Both Eyes Given 04/30/20 2002)  tetracaine (PONTOCAINE) 0.5 % ophthalmic solution 1 drop (1 drop Both Eyes Given 04/30/20 2002)    ED Course  I have reviewed the triage vital signs and the nursing notes.  Pertinent labs & imaging results that were available during my care of the patient were reviewed by me and considered in my medical decision making (see chart for details).    MDM Rules/Calculators/A&P                          I  have personally reviewed all imaging, labs and have interpreted them.    Visual Acuity  Right Eye Distance: 20/40 Left Eye Distance: 20/40 Bilateral Distance: 20/40  Intraocular pressure left 18 Intraocular pressure right 18    Using tetracaine drops the eyes were anesthetized and intraocular pressures were obtained.  Fluorescein was performed no fluorescein uptake noted.  Using a wood lamp I was evaluated no gross abnormalities noted, no dendritic OPC is noted in patient's eye, or Sindell sign noted.  I have low suspicion for bacterial or viral conjunctivitis as there is no crusting noted or eye discharge noted on exam.  Low suspicion for viral keratitis as there is no dendritic lesion noted on exam. low suspicion for hyphema as there is no blood noted in the anterior chamber of the eye.  Low suspicion for acute glaucoma as patient had no decrease in visual acuity.  Low suspicion for corneal abrasion as there is no fluorescein uptake.  Low suspicion for ocular globe rupture as intraocular pressure was 18 on both sides.  I suspect patient may be suffering from allergic conjunctivitis.  Will start patient on antihistamines and have her follow-up with ophthalmology for further evaluation management.  Vital signs have remained stable, no indication for hospital admission.  Patient discussed with attending and they agreed with assessment and plan.  Patient given at home care as well strict return precautions.  Patient verbalized that they understood agreed to said plan.  Final Clinical Impression(s) / ED Diagnoses Final diagnoses:  Eye irritation    Rx / DC Orders ED Discharge Orders         Ordered    loratadine (CLARITIN) 10 MG tablet  Daily        04/30/20 2015           Aron Baba 04/30/20 2039    Daleen Bo, MD 05/01/20 [phone]

## 2020-06-12 IMAGING — MG DIGITAL SCREENING BILATERAL MAMMOGRAM WITH TOMO AND CAD
8 series · 9 of 24 positions shown · non-contrast
Comparison: Previous exam(s).

CLINICAL DATA: Screening.

EXAM:
DIGITAL SCREENING BILATERAL MAMMOGRAM WITH TOMO AND CAD

[L MLO synth-2D]
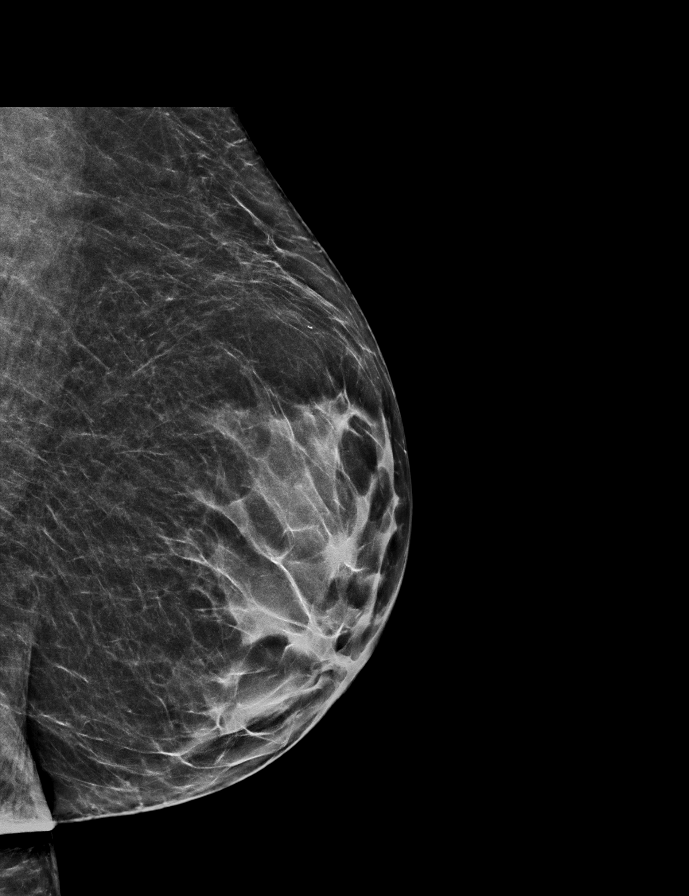

[L CC synth-2D]
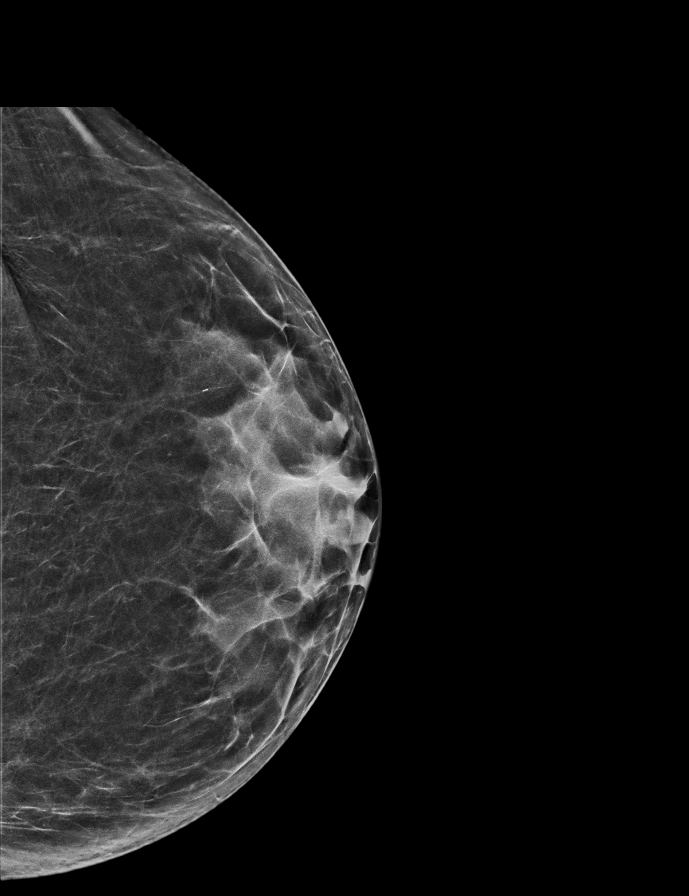

[R CC synth-2D]
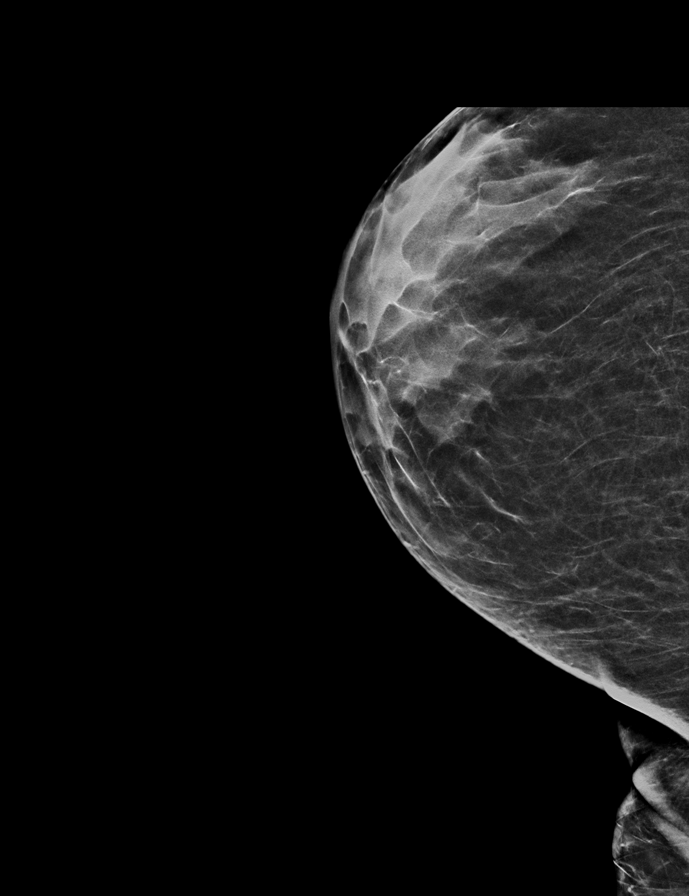

[R MLO synth-2D]
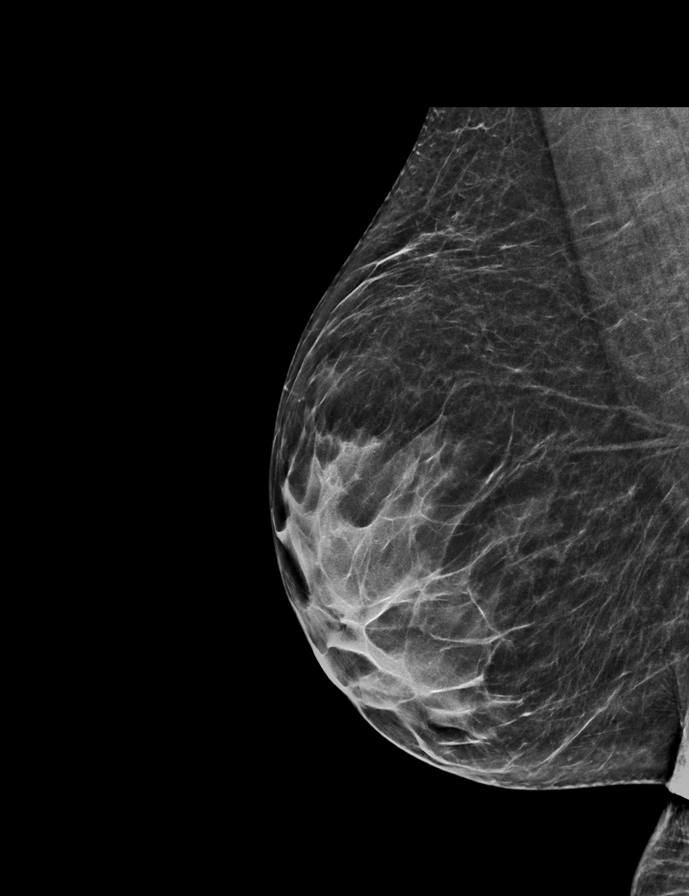

[R MLO tomo · 2 of 54 frames shown]
[frame 18/54]
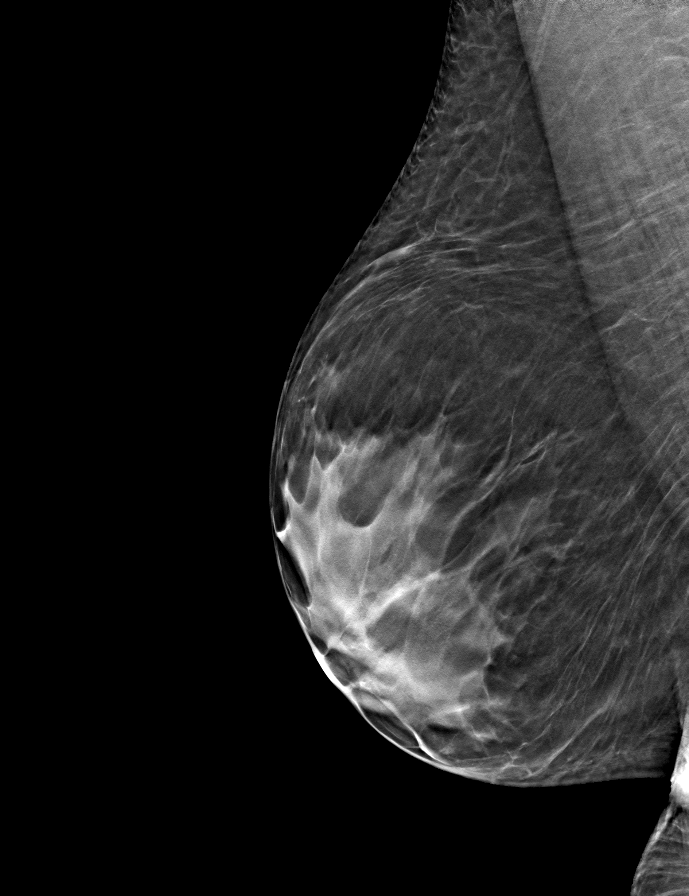
[frame 27/54]
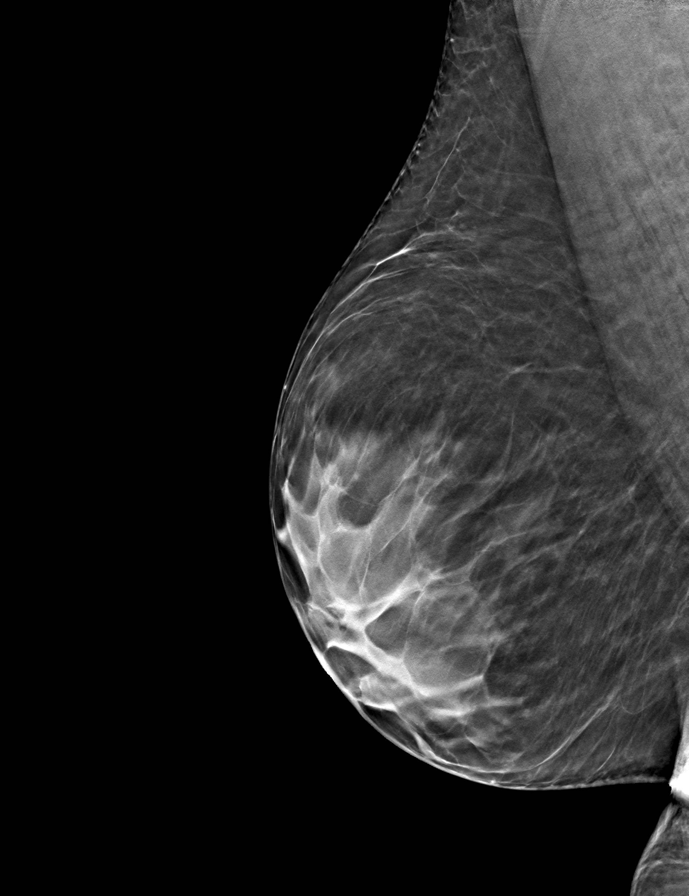

[L CC tomo · tomo slice 28/55.0]
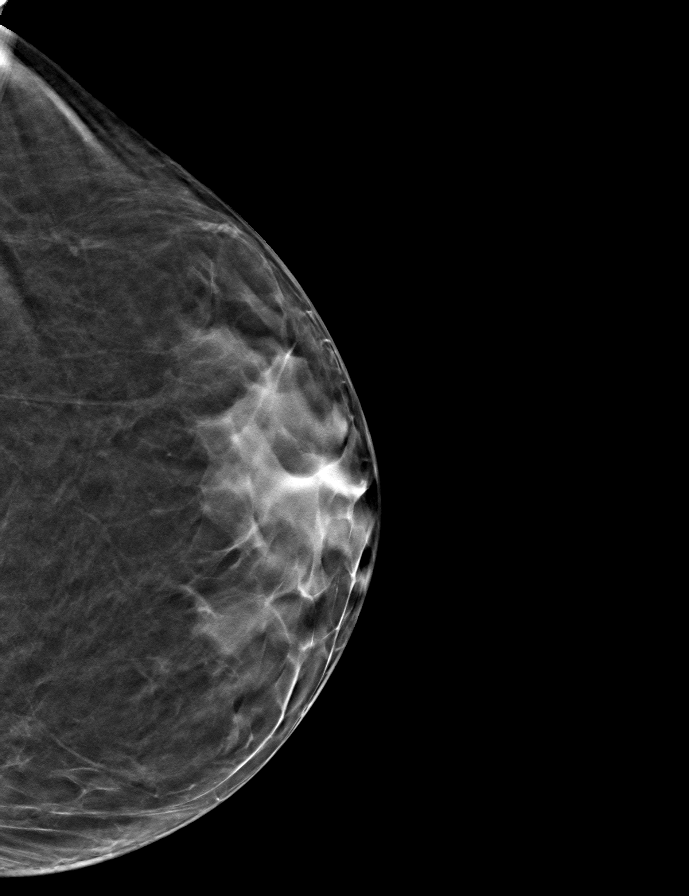

[R CC tomo · tomo slice 26/51.0]
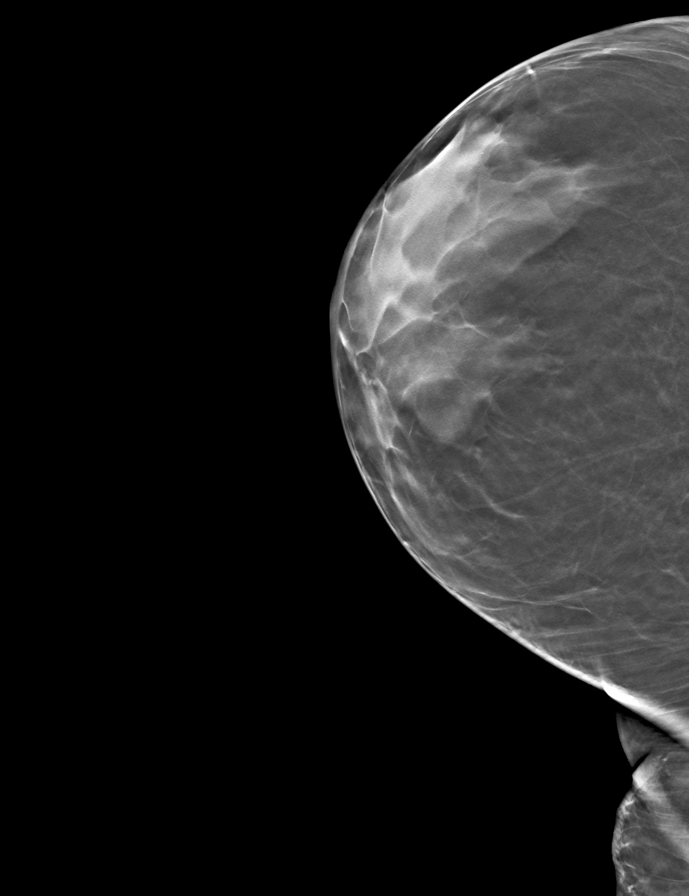

[L MLO tomo · tomo slice 28/55.0]
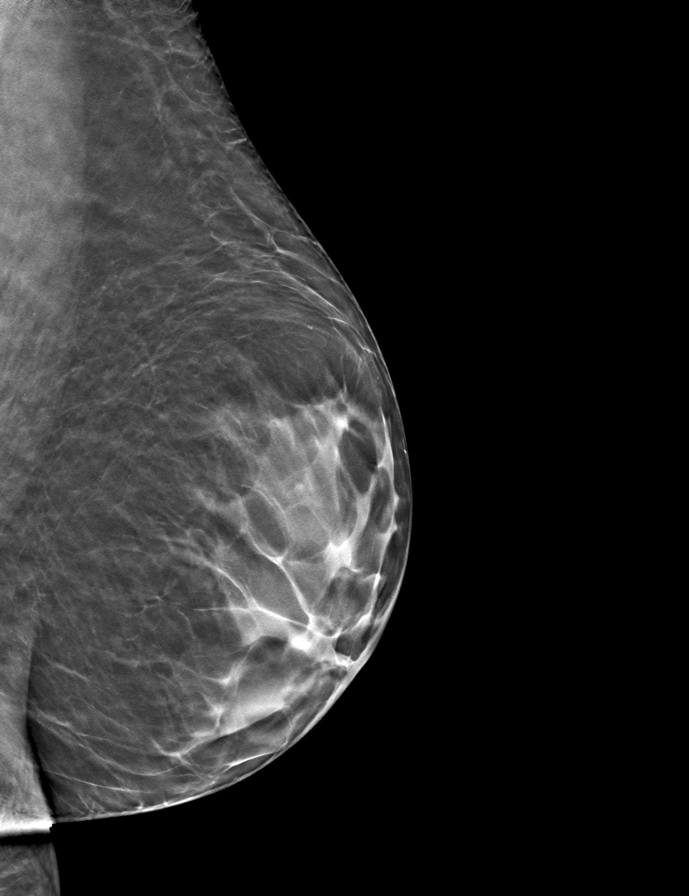

[9 of 24 positions shown; findings below may reference images not displayed]

ACR Breast Density Category c: The breast tissue is heterogeneously
dense, which may obscure small masses.
FINDINGS: There are no findings suspicious for malignancy. Images were
processed with CAD.
IMPRESSION: No mammographic evidence of malignancy. A result letter of this
screening mammogram will be mailed directly to the patient.

RECOMMENDATION:
Screening mammogram in one year. (Code:FT-U-LHB)

BI-RADS CATEGORY  1: Negative.

## 2021-10-14 DIAGNOSIS — Z23 Encounter for immunization: Secondary | ICD-10-CM | POA: Diagnosis not present

## 2021-10-14 DIAGNOSIS — Z6823 Body mass index (BMI) 23.0-23.9, adult: Secondary | ICD-10-CM | POA: Diagnosis not present

## 2021-10-14 DIAGNOSIS — E119 Type 2 diabetes mellitus without complications: Secondary | ICD-10-CM | POA: Diagnosis not present

## 2021-10-14 DIAGNOSIS — E785 Hyperlipidemia, unspecified: Secondary | ICD-10-CM | POA: Diagnosis not present

## 2021-10-14 DIAGNOSIS — I10 Essential (primary) hypertension: Secondary | ICD-10-CM | POA: Diagnosis not present

## 2021-10-14 DIAGNOSIS — E079 Disorder of thyroid, unspecified: Secondary | ICD-10-CM | POA: Diagnosis not present

## 2021-11-13 ENCOUNTER — Other Ambulatory Visit (HOSPITAL_COMMUNITY): Payer: Self-pay | Admitting: Family Medicine

## 2021-11-13 DIAGNOSIS — N63 Unspecified lump in unspecified breast: Secondary | ICD-10-CM

## 2021-11-13 DIAGNOSIS — N6310 Unspecified lump in the right breast, unspecified quadrant: Secondary | ICD-10-CM

## 2021-11-27 ENCOUNTER — Ambulatory Visit (HOSPITAL_COMMUNITY)
Admission: RE | Admit: 2021-11-27 | Discharge: 2021-11-27 | Disposition: A | Discharge status: Home / Self Care | Payer: 59 | Source: Ambulatory Visit | Attending: Family Medicine | Admitting: Family Medicine

## 2021-11-27 DIAGNOSIS — N63 Unspecified lump in unspecified breast: Secondary | ICD-10-CM | POA: Diagnosis not present

## 2021-11-27 DIAGNOSIS — N6310 Unspecified lump in the right breast, unspecified quadrant: Secondary | ICD-10-CM | POA: Insufficient documentation

## 2021-11-27 DIAGNOSIS — N6489 Other specified disorders of breast: Secondary | ICD-10-CM | POA: Diagnosis not present

## 2021-11-27 DIAGNOSIS — R922 Inconclusive mammogram: Secondary | ICD-10-CM | POA: Diagnosis not present

## 2021-12-13 DIAGNOSIS — S80212A Abrasion, left knee, initial encounter: Secondary | ICD-10-CM | POA: Diagnosis not present

## 2021-12-13 DIAGNOSIS — S0003XA Contusion of scalp, initial encounter: Secondary | ICD-10-CM | POA: Diagnosis not present

## 2021-12-13 DIAGNOSIS — S0083XA Contusion of other part of head, initial encounter: Secondary | ICD-10-CM | POA: Diagnosis not present

## 2021-12-13 DIAGNOSIS — H05231 Hemorrhage of right orbit: Secondary | ICD-10-CM | POA: Diagnosis not present

## 2021-12-13 DIAGNOSIS — R519 Headache, unspecified: Secondary | ICD-10-CM | POA: Diagnosis not present

## 2021-12-13 DIAGNOSIS — E1165 Type 2 diabetes mellitus with hyperglycemia: Secondary | ICD-10-CM | POA: Diagnosis not present

## 2021-12-29 DIAGNOSIS — Z79890 Hormone replacement therapy (postmenopausal): Secondary | ICD-10-CM | POA: Diagnosis not present

## 2021-12-29 DIAGNOSIS — L989 Disorder of the skin and subcutaneous tissue, unspecified: Secondary | ICD-10-CM | POA: Diagnosis not present

## 2021-12-29 DIAGNOSIS — E079 Disorder of thyroid, unspecified: Secondary | ICD-10-CM | POA: Diagnosis not present

## 2021-12-29 DIAGNOSIS — S79921A Unspecified injury of right thigh, initial encounter: Secondary | ICD-10-CM | POA: Diagnosis not present

## 2021-12-29 DIAGNOSIS — E119 Type 2 diabetes mellitus without complications: Secondary | ICD-10-CM | POA: Diagnosis not present

## 2021-12-29 DIAGNOSIS — I10 Essential (primary) hypertension: Secondary | ICD-10-CM | POA: Diagnosis not present

## 2021-12-29 DIAGNOSIS — Z7984 Long term (current) use of oral hypoglycemic drugs: Secondary | ICD-10-CM | POA: Diagnosis not present

## 2021-12-29 DIAGNOSIS — Z79899 Other long term (current) drug therapy: Secondary | ICD-10-CM | POA: Diagnosis not present

## 2021-12-30 DIAGNOSIS — L989 Disorder of the skin and subcutaneous tissue, unspecified: Secondary | ICD-10-CM | POA: Diagnosis not present

## 2021-12-30 DIAGNOSIS — I10 Essential (primary) hypertension: Secondary | ICD-10-CM | POA: Diagnosis not present

## 2021-12-30 DIAGNOSIS — E119 Type 2 diabetes mellitus without complications: Secondary | ICD-10-CM | POA: Diagnosis not present

## 2022-01-15 DIAGNOSIS — I129 Hypertensive chronic kidney disease with stage 1 through stage 4 chronic kidney disease, or unspecified chronic kidney disease: Secondary | ICD-10-CM | POA: Diagnosis not present

## 2022-01-15 DIAGNOSIS — Z7189 Other specified counseling: Secondary | ICD-10-CM | POA: Diagnosis not present

## 2022-01-15 DIAGNOSIS — D638 Anemia in other chronic diseases classified elsewhere: Secondary | ICD-10-CM | POA: Diagnosis not present

## 2022-01-15 DIAGNOSIS — E1122 Type 2 diabetes mellitus with diabetic chronic kidney disease: Secondary | ICD-10-CM | POA: Diagnosis not present

## 2022-01-15 DIAGNOSIS — N189 Chronic kidney disease, unspecified: Secondary | ICD-10-CM | POA: Diagnosis not present

## 2022-01-15 DIAGNOSIS — E039 Hypothyroidism, unspecified: Secondary | ICD-10-CM | POA: Diagnosis not present

## 2022-01-17 DIAGNOSIS — E785 Hyperlipidemia, unspecified: Secondary | ICD-10-CM | POA: Diagnosis not present

## 2022-01-17 DIAGNOSIS — L989 Disorder of the skin and subcutaneous tissue, unspecified: Secondary | ICD-10-CM | POA: Diagnosis not present

## 2022-01-17 DIAGNOSIS — E119 Type 2 diabetes mellitus without complications: Secondary | ICD-10-CM | POA: Diagnosis not present

## 2022-01-17 DIAGNOSIS — I10 Essential (primary) hypertension: Secondary | ICD-10-CM | POA: Diagnosis not present

## 2022-01-17 DIAGNOSIS — E079 Disorder of thyroid, unspecified: Secondary | ICD-10-CM | POA: Diagnosis not present

## 2022-01-24 DIAGNOSIS — N1831 Chronic kidney disease, stage 3a: Secondary | ICD-10-CM | POA: Diagnosis not present

## 2022-01-24 DIAGNOSIS — R809 Proteinuria, unspecified: Secondary | ICD-10-CM | POA: Diagnosis not present

## 2022-01-24 DIAGNOSIS — Z1159 Encounter for screening for other viral diseases: Secondary | ICD-10-CM | POA: Diagnosis not present

## 2022-01-24 DIAGNOSIS — D631 Anemia in chronic kidney disease: Secondary | ICD-10-CM | POA: Diagnosis not present

## 2022-01-24 DIAGNOSIS — Z79899 Other long term (current) drug therapy: Secondary | ICD-10-CM | POA: Diagnosis not present

## 2022-01-24 DIAGNOSIS — E559 Vitamin D deficiency, unspecified: Secondary | ICD-10-CM | POA: Diagnosis not present

## 2022-02-12 DIAGNOSIS — E119 Type 2 diabetes mellitus without complications: Secondary | ICD-10-CM | POA: Diagnosis not present

## 2022-02-12 DIAGNOSIS — Z03818 Encounter for observation for suspected exposure to other biological agents ruled out: Secondary | ICD-10-CM | POA: Diagnosis not present

## 2022-02-13 DIAGNOSIS — E113293 Type 2 diabetes mellitus with mild nonproliferative diabetic retinopathy without macular edema, bilateral: Secondary | ICD-10-CM | POA: Diagnosis not present

## 2022-02-13 DIAGNOSIS — Z7984 Long term (current) use of oral hypoglycemic drugs: Secondary | ICD-10-CM | POA: Diagnosis not present

## 2022-02-13 DIAGNOSIS — Z961 Presence of intraocular lens: Secondary | ICD-10-CM | POA: Diagnosis not present

## 2022-02-13 DIAGNOSIS — Z794 Long term (current) use of insulin: Secondary | ICD-10-CM | POA: Diagnosis not present

## 2022-02-26 ENCOUNTER — Other Ambulatory Visit (HOSPITAL_COMMUNITY): Payer: Self-pay | Admitting: Nephrology

## 2022-02-26 ENCOUNTER — Other Ambulatory Visit: Payer: Self-pay | Admitting: Nephrology

## 2022-02-26 DIAGNOSIS — I129 Hypertensive chronic kidney disease with stage 1 through stage 4 chronic kidney disease, or unspecified chronic kidney disease: Secondary | ICD-10-CM

## 2022-02-26 DIAGNOSIS — D638 Anemia in other chronic diseases classified elsewhere: Secondary | ICD-10-CM | POA: Diagnosis not present

## 2022-02-26 DIAGNOSIS — E559 Vitamin D deficiency, unspecified: Secondary | ICD-10-CM | POA: Diagnosis not present

## 2022-02-26 DIAGNOSIS — R768 Other specified abnormal immunological findings in serum: Secondary | ICD-10-CM | POA: Diagnosis not present

## 2022-02-26 DIAGNOSIS — E1122 Type 2 diabetes mellitus with diabetic chronic kidney disease: Secondary | ICD-10-CM | POA: Diagnosis not present

## 2022-02-26 DIAGNOSIS — N189 Chronic kidney disease, unspecified: Secondary | ICD-10-CM | POA: Diagnosis not present

## 2022-05-12 DIAGNOSIS — E559 Vitamin D deficiency, unspecified: Secondary | ICD-10-CM | POA: Diagnosis not present

## 2022-05-12 DIAGNOSIS — E119 Type 2 diabetes mellitus without complications: Secondary | ICD-10-CM | POA: Diagnosis not present

## 2022-05-12 DIAGNOSIS — N189 Chronic kidney disease, unspecified: Secondary | ICD-10-CM | POA: Diagnosis not present

## 2022-05-12 DIAGNOSIS — E785 Hyperlipidemia, unspecified: Secondary | ICD-10-CM | POA: Diagnosis not present

## 2022-05-12 DIAGNOSIS — E079 Disorder of thyroid, unspecified: Secondary | ICD-10-CM | POA: Diagnosis not present

## 2022-05-12 DIAGNOSIS — Z23 Encounter for immunization: Secondary | ICD-10-CM | POA: Diagnosis not present

## 2022-05-12 DIAGNOSIS — I10 Essential (primary) hypertension: Secondary | ICD-10-CM | POA: Diagnosis not present

## 2022-07-01 DIAGNOSIS — N189 Chronic kidney disease, unspecified: Secondary | ICD-10-CM | POA: Diagnosis not present

## 2022-07-01 DIAGNOSIS — E039 Hypothyroidism, unspecified: Secondary | ICD-10-CM | POA: Diagnosis not present

## 2022-07-01 DIAGNOSIS — D638 Anemia in other chronic diseases classified elsewhere: Secondary | ICD-10-CM | POA: Diagnosis not present

## 2022-07-01 DIAGNOSIS — I129 Hypertensive chronic kidney disease with stage 1 through stage 4 chronic kidney disease, or unspecified chronic kidney disease: Secondary | ICD-10-CM | POA: Diagnosis not present

## 2022-07-01 DIAGNOSIS — N170 Acute kidney failure with tubular necrosis: Secondary | ICD-10-CM | POA: Diagnosis not present

## 2022-07-01 DIAGNOSIS — E1122 Type 2 diabetes mellitus with diabetic chronic kidney disease: Secondary | ICD-10-CM | POA: Diagnosis not present

## 2022-07-01 DIAGNOSIS — R768 Other specified abnormal immunological findings in serum: Secondary | ICD-10-CM | POA: Diagnosis not present

## 2022-07-01 DIAGNOSIS — E559 Vitamin D deficiency, unspecified: Secondary | ICD-10-CM | POA: Diagnosis not present

## 2022-07-11 DIAGNOSIS — N189 Chronic kidney disease, unspecified: Secondary | ICD-10-CM | POA: Diagnosis not present

## 2023-12-23 ENCOUNTER — Ambulatory Visit: Admitting: Podiatry

## 2024-01-01 ENCOUNTER — Ambulatory Visit (INDEPENDENT_AMBULATORY_CARE_PROVIDER_SITE_OTHER): Admitting: Podiatry

## 2024-01-01 ENCOUNTER — Encounter: Payer: Self-pay | Admitting: Podiatry

## 2024-01-01 VITALS — BP 128/78 | HR 65 | Temp 97.5°F | Resp 16 | Ht 60.0 in | Wt 120.0 lb

## 2024-01-01 DIAGNOSIS — M2041 Other hammer toe(s) (acquired), right foot: Secondary | ICD-10-CM

## 2024-01-01 DIAGNOSIS — M2042 Other hammer toe(s) (acquired), left foot: Secondary | ICD-10-CM

## 2024-01-02 NOTE — Progress Notes (Signed)
 Subjective:   Patient ID: Sandy Price, female   DOB: 58 y.o.   MRN: [id number]   HPI Patient presents with digital deformities of the 2nd and 4th toe right foot that have become relatively tender at different times and she is concerned about this and whether or not correction may be necessary.  Patient does not smoke tries to be active and does have diabetes under reasonably good control   Review of Systems  All other systems reviewed and are negative.       Objective:  Physical Exam Vitals and nursing note reviewed.  Constitutional:      Appearance: She is well-developed.  Pulmonary:     Effort: Pulmonary effort is normal.  Musculoskeletal:        General: Normal range of motion.  Skin:    General: Skin is warm.  Neurological:     Mental Status: She is alert.     Neurovascular status found to be intact muscle strength adequate subtalar midtarsal joint with range of motion found to be within normal limits.  Patient is noted to have moderate elevation of digits 2 4 right foot with dorsal keratotic lesions which are moderately discomforting and reactive and tight tennis shoes seem to set these off     Assessment:  Probability for hammertoe deformity digits 2 4 right localized of the more recent duration     Plan:  H&P reviewed and at this point I educated her on digital deformities do not recommend surgical intervention and eventually this may require other treatment processes.  At this time patient is to be seen back as needed and will utilize shoe gear modifications
